# Patient Record
Sex: Male | Born: 1955 | Race: Black or African American | Hispanic: No | Marital: Single | Smoking: Current every day smoker
Health system: Southern US, Community
[De-identification: ages and names within clinical notes are randomized; demographics above are authoritative.]

## PROBLEM LIST (undated history)

## (undated) DIAGNOSIS — E119 Type 2 diabetes mellitus without complications: Secondary | ICD-10-CM

## (undated) DIAGNOSIS — I509 Heart failure, unspecified: Secondary | ICD-10-CM

## (undated) DIAGNOSIS — I219 Acute myocardial infarction, unspecified: Secondary | ICD-10-CM

## (undated) DIAGNOSIS — I10 Essential (primary) hypertension: Secondary | ICD-10-CM

## (undated) HISTORY — PX: CORONARY ANGIOPLASTY WITH STENT PLACEMENT: SHX49

---

## 1999-07-28 ENCOUNTER — Emergency Department (HOSPITAL_COMMUNITY): Admission: EM | Admit: 1999-07-28 | Discharge: 1999-07-28 | Payer: Self-pay | Admitting: Emergency Medicine

## 2007-10-14 ENCOUNTER — Inpatient Hospital Stay (HOSPITAL_COMMUNITY): Admission: EM | Admit: 2007-10-14 | Discharge: 2007-10-18 | Payer: Self-pay | Admitting: Emergency Medicine

## 2007-11-07 ENCOUNTER — Ambulatory Visit: Payer: Self-pay | Admitting: Family Medicine

## 2007-11-12 ENCOUNTER — Ambulatory Visit: Payer: Self-pay | Admitting: *Deleted

## 2008-04-15 ENCOUNTER — Ambulatory Visit: Payer: Self-pay | Admitting: Family Medicine

## 2008-04-15 LAB — CONVERTED CEMR LAB
ALT: 14 U/L
AST: 15 U/L
Albumin: 4.5 g/dL
Alkaline Phosphatase: 50 U/L
BUN: 15 mg/dL
Band Neutrophils: 0 %
Basophils Absolute: 0 K/uL
Basophils Relative: 0 %
CO2: 21 meq/L
Calcium: 9.4 mg/dL
Chloride: 106 meq/L
Cholesterol: 129 mg/dL
Creatinine, Ser: 1.13 mg/dL
Eosinophils Absolute: 0.3 K/uL
Eosinophils Relative: 4 %
Glucose, Bld: 100 mg/dL — ABNORMAL HIGH
HCT: 44.2 %
HDL: 34 mg/dL — ABNORMAL LOW
Hemoglobin: 15 g/dL
LDL Cholesterol: 69 mg/dL
Lymphocytes Relative: 39 %
Lymphs Abs: 2.7 K/uL
MCHC: 33.9 g/dL
MCV: 88 fL
Monocytes Absolute: 0.7 K/uL
Monocytes Relative: 10 %
Neutro Abs: 3.3 K/uL
Neutrophils Relative %: 47 %
PSA: 0.2 ng/mL
Platelets: 189 K/uL
Potassium: 4.3 meq/L
RBC: 5.02 M/uL
RDW: 15.1 %
Sodium: 140 meq/L
Total Bilirubin: 0.8 mg/dL
Total CHOL/HDL Ratio: 3.8
Total Protein: 7.6 g/dL
Triglycerides: 131 mg/dL
VLDL: 26 mg/dL
WBC: 7 10*3/microliter

## 2008-04-17 ENCOUNTER — Ambulatory Visit: Payer: Self-pay | Admitting: Family Medicine

## 2008-05-22 ENCOUNTER — Ambulatory Visit: Payer: Self-pay | Admitting: Internal Medicine

## 2008-10-05 ENCOUNTER — Ambulatory Visit: Payer: Self-pay | Admitting: Family Medicine

## 2008-10-27 ENCOUNTER — Ambulatory Visit: Payer: Self-pay | Admitting: Family Medicine

## 2008-10-27 LAB — CONVERTED CEMR LAB
Albumin: 4.6 g/dL (ref 3.5–5.2)
CO2: 23 meq/L (ref 19–32)
Chloride: 104 meq/L (ref 96–112)
Cholesterol: 138 mg/dL (ref 0–200)
HDL: 34 mg/dL — ABNORMAL LOW (ref >39)
Indirect Bilirubin: 0.5 mg/dL (ref 0.0–0.9)
Total Bilirubin: 0.6 mg/dL (ref 0.3–1.2)
Total CHOL/HDL Ratio: 4.1
Total Protein: 7.3 g/dL (ref 6.0–8.3)
VLDL: 31 mg/dL (ref 0–40)

## 2009-04-13 ENCOUNTER — Ambulatory Visit: Payer: Self-pay | Admitting: Family Medicine

## 2009-04-15 ENCOUNTER — Ambulatory Visit (HOSPITAL_COMMUNITY): Admission: RE | Admit: 2009-04-15 | Discharge: 2009-04-15 | Payer: Self-pay | Admitting: Family Medicine

## 2009-08-27 ENCOUNTER — Encounter (INDEPENDENT_AMBULATORY_CARE_PROVIDER_SITE_OTHER): Payer: Self-pay | Admitting: Family Medicine

## 2009-08-27 ENCOUNTER — Ambulatory Visit: Payer: Self-pay | Admitting: Internal Medicine

## 2009-08-27 LAB — CONVERTED CEMR LAB
CO2: 21 meq/L (ref 19–32)
Chloride: 103 meq/L (ref 96–112)
Glucose, Bld: 86 mg/dL (ref 70–99)
LDL Cholesterol: 85 mg/dL (ref 0–99)
Potassium: 4 meq/L (ref 3.5–5.3)
Sodium: 137 meq/L (ref 135–145)
Triglycerides: 136 mg/dL (ref <150)

## 2009-10-12 ENCOUNTER — Ambulatory Visit: Payer: Self-pay | Admitting: Internal Medicine

## 2009-10-12 ENCOUNTER — Encounter (INDEPENDENT_AMBULATORY_CARE_PROVIDER_SITE_OTHER): Payer: Self-pay | Admitting: Family Medicine

## 2009-10-12 LAB — CONVERTED CEMR LAB: Total CK: 160 units/L (ref 7–232)

## 2010-06-21 NOTE — Cardiovascular Report (Signed)
Eric Benson, Eric Benson                ACCOUNT NO.:  [id number]   MEDICAL RECORD NO.:  [id number]          PATIENT TYPE:  INP   LOCATION:  6532                         FACILITY:  [facility]   PHYSICIAN:  Mohan N. Sharyn Lull, M.D. DATE OF BIRTH:  [birth year]   DATE OF PROCEDURE:  10/17/2007  DATE OF DISCHARGE:                            CARDIAC CATHETERIZATION   PROCEDURES:  1. Successful percutaneous transluminal coronary angioplasty to      proximal and ostial left anterior descending using 2.5 x 12 mm long      Voyager balloon.  2. Successful deployment of 3.5 x 33 mm long Cypher drug-eluting stent      in proximal and ostial left anterior descending.  3. Successful post-dilatation of Cypher drug-eluting stent using 3.75      x 20 mm long [place] Voyager balloon.   INDICATION FOR THE PROCEDURE:  Eric Benson is a 55 year old black male with  past medical history significant for labile hypertension, not on any  medications; tobacco abuse; alcohol abuse; strong family history of  coronary artery disease.  He came to the ER on October 14, 2007,  complaining of recurrent retrosternal chest pressure for the last 1 week  off and on.  He did not seek any medical attention until October 14, 2007, as pain got worse, grade 8/10, associated with shortness of  breath.  Chest pain described as pressure.  Denies any nausea, vomiting,  diaphoresis.  Denies palpitation, lightheadedness, or syncope.  Denies  PND, orthopnea, leg swelling.  The patient was noted to have elevated  cardiac enzymes.  EKG showed normal sinus rhythm with LVH with strain  pattern versus ischemia.  Presently, the patient denies any chest pain.  The patient was noted to be in hypertensive urgency with initial blood  pressure was 202/147.  The patient received IV nitrates and Lasix and  beta-blockers with reduction in his blood pressure and improvement in  symptoms.  The patient did rule in for non-Q-wave myocardial infarction.  Subsequently,  the patient underwent left cardiac cath with selective  left and right coronary angiography on October 15, 2007, as per  procedure report and was noted to have critical stenosis in ostial and  proximal LAD.  The patient was also noted to have dilated LV with LVEDP  of 40 mmHg at the end of the procedure with EF of 15-20%.  The patient  was treated medically with IV Lasix and ACE and beta-blockers with good  diuresis.  He is brought here for elective PTCA stenting to proximal and  ostial LAD.   PROCEDURE:  After obtaining the informed consent, the patient was  brought to the Cath Lab and was placed on fluoroscopy table.  Right and  left groin were prepped.  With the help of thin-wall needle, 6-French  arterial sheath was placed.  The sheath was aspirated and flushed.  Next, 6-French left Judkins catheter was advanced.  Next, 6-French XD4  guiding catheter was advanced over the wire under fluoroscopic guidance  up to the ascending aorta.  Wire was pulled out.  The catheter was  aspirated and  connected to the manifold.  Catheter was further advanced  and engaged selectively into LAD.  A single view of LAD was taken.   FINDINGS:  LAD has 70% ostial and 85% proximal stenosis as before.   INTERVENTIONAL PROCEDURES:  Successful PTCA to proximal and ostial left  anterior descending was done using 2.5 x 12 mm long Voyager balloon for  predilatation and then 3.5 x 33 mm long Cypher drug-eluting stent was  deployed in proximal and ostial LAD at 15 atmospheric pressure.  Stent  was post-dilated using 3.75 x 20 mm long [place] Voyager balloon going up to  18 and 20 atmospheric pressure.  Lesion was dilated from 70 and 85% to  0% residual with excellent TIMI grade 3 distal flow without evidence of  dissection or distal embolization.  The patient  received weight-based heparin and 300 mg of Plavix during the procedure  and was continued on Integrilin drip.  The patient tolerated procedure  well.  There  were no complications.  The patient was transferred to  recovery room in stable condition.      [name]. Sharyn Lull, M.D.  Electronically Signed     MNH/MEDQ  D:  10/17/2007  T:  10/17/2007  Job:  [id number]   cc:   Cath Lab

## 2010-06-21 NOTE — Cardiovascular Report (Signed)
NAMEJEFFREN, Eric Benson NO.:  [id number]   MEDICAL RECORD NO.:  [id number]          PATIENT TYPE:  INP   LOCATION:  2903                         FACILITY:  [facility]   PHYSICIAN:  [name]. Sharyn Lull, M.D. DATE OF BIRTH:  07-18-1955   DATE OF PROCEDURE:  DATE OF DISCHARGE:                            CARDIAC CATHETERIZATION   PROCEDURES:  Left cardiac catheterization with selective left and right  coronary angiography, left ventriculography via right groin using  Judkins technique.   INDICATIONS FOR PROCEDURE:  Mr. Turnley is a 55 year old black male with  past medical history significant for labile hypertension, not on any  medications; tobacco abuse; alcohol abuse; strong family history of  coronary artery disease.  He came to the ER complaining of retrosternal  recurrent chest pressure for the last 1 week off and on.  Did not seek  any medical attention until today, as pain got worse, grade 8/10  associated with shortness of breath.  Chest pain described as pressure.  Denies any nausea, vomiting, diaphoresis.  Denies palpitation,  lightheadedness, or syncope.  Denies PND, orthopnea, or leg swelling.  The patient was noted to have elevated CPK-MB and troponin I.  EKG  showed normal sinus rhythm with LVH with strain pattern versus ischemia.  Presently, the patient denies any chest pain or shortness of breath.   PAST MEDICAL HISTORY:  As above.   PAST SURGICAL HISTORY:  None.   ALLERGIES:  None.   MEDICATIONS:  None.   SOCIAL HISTORY:  Single.  No children.  Works as Investment banker, corporate.  Smokes half-  pack per day for 28 years.  Drinks beer on weekends for the last 34  years.   FAMILY HISTORY:  Father died of stroke.  He was hypertensive at the age  of 64.  One brother died of MI at the age of 16.  Mother is  hypertensive.  One sister is hypertensive.   PHYSICAL EXAMINATION:  GENERAL:  He was alert, awake, and oriented x3 in  no acute distress.  VITAL SIGNS:  Blood  pressure was 171/95, pulse was 100, regular.  HEENT:  Conjunctivae were pink.  NECK:  Supple.  Positive JVD.  LUNGS:  Has decreased breath sounds with occasional rales.  CARDIOVASCULAR:  S1 and S2 was normal.  There was soft systolic murmur  and S3 and S4 gallop.  ABDOMEN:  Soft, bowel sounds present, nontender.  EXTREMITIES:  There is no clubbing, cyanosis, or edema.   LABORATORY DATA:  His CK was 647, MB 16.6, relative index 2.6.  Troponin  I was 0.83, second set troponin I was 4.72, third set troponin I 6.28.  CPK was 682, second set CPK 848, MB 65.7, relative index 7.7, third set  CPK 682, MB 42.6, relative index 6.2.  His BNP was 818.   BRIEF HOSPITAL COURSE:  The patient was admitted to CCU.  The patient  ruled in for non-Q-wave myocardial infarction.  Discussed with the  patient regarding left cath, possible PTCA stenting, its risks and  benefits, i.e., death, MI, stroke, need for emergency CABG, risk of  restenosis, local vascular  complications, etc. and consented for PCI.   PROCEDURE:  After obtaining the informed consent, the patient was  brought to the Cath Lab and was placed on fluoroscopy table.  Right  groin was prepped and draped in usual fashion.  A 1% Xylocaine was used  for local anesthesia in the right groin.  With the help of thin-wall  needle, a 6-French arterial sheath was placed.  The sheath was aspirated  and flushed.  Next, a 6-French left Judkins catheter was advanced over  the wire under fluoroscopic guidance up to the ascending aorta.  Wire  was pulled out.  The catheter was aspirated and connected to the  manifold.  Catheter was further advanced and engaged into left coronary  ostium.  The patient had dampening of pressure with diagnostic catheter,  which was exchanged to 6-French JL-4 guiding catheter, which was  advanced over the wire under fluoroscopic guidance up to the ascending  aorta.  Wire was pulled out.  The catheter was aspirated and connected   to the manifold.  Catheter was further advanced and engaged into left  coronary ostium.  Multiple views of the left system were taken.  Next,  the catheter was disengaged and was pulled out over the wire and was  replaced with 6-French right Judkins catheter, which was advanced over  the wire under fluoroscopic guidance up to the ascending aorta.  Wire  was pulled out.  The catheter was aspirated and connected to the  manifold.  Catheter was further advanced and attempted to engage into  right coronary ostium without success.  This catheter was exchanged to  AL-1 diagnostic catheter, which was advanced over the wire under  fluoroscopic guidance up to the ascending aorta.  Wire was pulled out.  The catheter was aspirated and connected to the manifold.  Catheter was  further advanced and engaged into right coronary ostium, which rises  anteriorly.  Multiple views of the right coronary artery was obtained.  Next, the catheter was disengaged and was pulled out over the wire and  was replaced with 6-French pigtail catheter, which was advanced over the  wire under fluoroscopic guidance up to the ascending aorta.  Wire was  pulled out.  The catheter was aspirated and connected to the manifold.  Catheter was further advanced across the aortic valve into the LV.  LV  pressures were recorded.  Next, LV graft was done in 30-degree RAO  position.  Post-angiographic pressures were recorded from LV and then  pullback pressures were recorded from the aorta.  There was no gradient  across the aortic valve.  Next, pigtail catheter was pulled out over the  wire.  Sheaths were aspirated and flushed.   FINDINGS:  LV showed moderate-to-severe LVH, global hypokinesia, EF of  15-20%.  Postprocedure LVEDP was approximately 40 mmHg.  The patient did  receive 40 of IV Lasix after the procedure.  Left main was very very  short, had almost separate LAD and left circumflex ostium.  LAD has 70%  ostial and 80-85%  proximal stenosis with haziness with TIMI 3 flow,  which appears to be the culprit lesion for his non-Q-wave MI.  Diagonal  1 has 30-40% mid diffuse stenosis.  Left circumflex has 10-15% proximal  and mid stenosis and 50-60% distal stenosis.  Diagonal obtuse marginal 1  and 2 are very very small.  OM3 has 30-40% proximal stenosis.  RCA has  20-30% diffuse proximal and mid stenosis.  PDA and PLV branches were  small,  which were patent.  The patient tolerated the procedure well.  There were no complications.  Plan is to maximize his ACE and beta-  blockers and diuretics, and we will schedule him for PCI to LAD in few  days when he is fully compensated.      [name]. Sharyn Lull, M.D.  Electronically Signed     MNH/MEDQ  D:  10/15/2007  T:  10/15/2007  Job:  [id number]   cc:   Cath Lab

## 2010-06-21 NOTE — Discharge Summary (Signed)
[name], Benson                ACCOUNT NO.:  [id number]   MEDICAL RECORD NO.:  [id number]          PATIENT TYPE:  INP   LOCATION:  6532                         FACILITY:  [facility]   PHYSICIAN:  Mohan N. Sharyn Lull, M.D. DATE OF BIRTH:  [birth year]   DATE OF ADMISSION:  10/14/2007  DATE OF DISCHARGE:  10/18/2007                               DISCHARGE SUMMARY   ADMITTING DIAGNOSES:  1. Status post recent non-Q-wave myocardial infarction.  2. Hypertensive urgency.  3. Complicated mild congestive heart failure.  4. Tobacco abuse.  5. Alcohol abuse.   FINAL DIAGNOSES:  1. Status post recent non-Q-wave myocardial infarction.  2. Status post left catheterization and percutaneous transluminal      coronary angioplasty stenting to proximal and ostial left anterior      descending.  3. Status post hypertensive urgency.  4. Concentrated congestive systolic heart failure.  5. Hypercholesterolemia.  6. History of tobacco abuse and alcohol abuse.   DISCHARGE HOME MEDICATIONS:  1. Enteric-coated aspirin 325 mg 1 tablet daily.  2. Plavix 75 mg 1 tablet daily with food.  3. Coreg 12.5 mg 1 tablet every 12 hours.  4. Lotensin 20 mg 1 tablet twice daily.  5. Crestor 20 mg 1 tablet daily.  6. Nitrostat 0.4 mg sublingual, use as directed.   DIET:  Low-salt and low-cholesterol.   Post PTCA stent instructions have been given.  The patient has been  advised to increase activity slowly.  If he develops shortness of  breath, swelling of legs, or gains more than 2 pounds, should call my  office immediately.  Followup with me in 1 week.   CONDITION AT DISCHARGE:  Stable.  The patient is scheduled for phase 2  cardiac rehab as outpatient.   BRIEF HISTORY AND HOSPITAL COURSE:  [name] is a 55 year old black male  with past medical history significant for labile hypertension, not on  any medications, tobacco abuse, alcohol abuse, and strong family history  of coronary artery disease.  He came to the  ER complaining of recurrent  retrosternal chest pressure for last 1 week off and on, did not seek any  medical attention, until day of admission as the pain got worse, rate  8/10 associated with shortness of breath, chest pain described as  pressure.  Denies any nausea, vomiting, or diaphoresis.  Denies  palpitation, lightheadedness or syncope.  Denies PND, orthopnea, and leg  swelling.  The patient was noted to have elevated CPK-MB and troponin I.  EKG showed normal sinus rhythm with LVH with strain pattern versus  ischemia.  Presently, the patient denies any chest pain or shortness of  breath.   PAST MEDICAL HISTORY:  As above.   PAST SURGICAL HISTORY:  None.   ALLERGIES:  None.   MEDICATION:  None.   SOCIAL HISTORY:  Single, no children.  The patient smoked half-pack per  day for 28 years, drinks beer on weekends for 34 years.   FAMILY HISTORY:  His father died of a stroke, labile hypertension at the  age of 59, mother is alive, she is hypertensive, 1 brother died  of MI at  the age of 75, and 1 sister is hypertensive.   PHYSICAL EXAMINATION:  GENERAL:  Alert, awake, and oriented x3, in no  acute distress.  VITAL SIGNS:  Initial blood pressure was 202/147, the peak pressure of  IV nitrates, morphine and beta blockers  was 171/95, pulse was 100.  Initial pulse was 120, final check, repeat pulse was 100.  HEENT:  Conjunctivae was pink.  NECK:  Supple.  No JVD.  LUNGS:  He has decreased breath sound at bases with occasional rales.  CARDIOVASCULAR:  S1 and S2 was normal.  There was soft systolic murmur  and S3 and S4 gallop.  ABDOMEN:  Soft.  Bowel sounds were present, nontender.  EXTREMITIES:  There is no clubbing, cyanosis, or edema.   LABORATORIES:  Hemoglobin was 15.6, hematocrit 47.2, and white count of  8.6.  CPK-MB was 13.9 and troponin I was 0.29.  Repeat CPK by lab was  647, MB 16.6, and relative index 2.6.  Troponin I was 0.83.  BNP was  818.  Repeat CPK on October 16, 2007, was 2.2, MB 7.0, troponin I was  2.81, and BNP was 313.  BUN 8 and creatinine 1.03.   BRIEF HOSPITAL COURSE:  The patient was admitted to CPU.  The patient  ruled in for non-Q-wave myocardial infarction.  The patient was started  on IV heparin, nitrates, and Integrilin, and subsequently underwent left  cardiac cath on October 15, 2007.  As per procedure report, the patient  postprocedure had very high LVEDP and was decided to treat for heart  failure cause and bring him back intervention through LAD.  The patient  was subsequently diuresed and was continued on beta-blockers and ACE  inhibitor, and was brought back to the cath lab and underwent PTCA  stenting to proximal and ostial LAD as per procedure request on  October 17, 2007.  The patient did not have any episodes of chest pain  during the hospital stay and volume appears to be stable with no  evidence of hematoma or bruits.  Phase 1 cardiac rehab was called.  The  patient has been ambulating in hallway without any problems.  His groin  is stable.  The patient will be discharged home on all of her  medications and will be followed up in the office in 1 week.  He will  repeat his 2-D echo in few weeks if his LVEF remains below 35% with  myocardial infarct.  ICD implantation as outpatient.  The patient also  has been advised to stop smoking and drinking alcohol to which he  agrees.  The patient is scheduled for phase 2 cardiac rehab as an  outpatient.     Eric Benson. Sharyn Lull, M.D.  Electronically Signed    MNH/MEDQ  D:  10/18/2007  T:  10/19/2007  Job:  [id number]

## 2010-11-09 LAB — CBC
HCT: 36.3 — ABNORMAL LOW
Hemoglobin: 10.3 — ABNORMAL LOW
Hemoglobin: 12.4 — ABNORMAL LOW
Hemoglobin: 12.5 — ABNORMAL LOW
Hemoglobin: 15.6
MCHC: 33.3
MCHC: 33.7
MCHC: 33.8
MCHC: 34.3
MCV: 90.2
MCV: 91.1
MCV: 91.6
Platelets: 136 — ABNORMAL LOW
Platelets: 166
Platelets: 191
RBC: 3.32 — ABNORMAL LOW
RBC: 4.56
RBC: 5.09
RDW: 13.9
RDW: 13.9
RDW: 14
WBC: 7.9
WBC: 8.6
WBC: 9

## 2010-11-09 LAB — HEPARIN LEVEL (UNFRACTIONATED)
Heparin Unfractionated: 0.26 — ABNORMAL LOW
Heparin Unfractionated: 0.36
Heparin Unfractionated: 0.37
Heparin Unfractionated: 0.48
Heparin Unfractionated: 0.52

## 2010-11-09 LAB — BASIC METABOLIC PANEL
CO2: 26
CO2: 26
CO2: 27
Calcium: 8.6
Chloride: 102
Chloride: 105
Creatinine, Ser: 1.07
Creatinine, Ser: 1.1
GFR calc Af Amer: >60
GFR calc Af Amer: >60
GFR calc non Af Amer: >60
Glucose, Bld: 139 — ABNORMAL HIGH
Potassium: 3.3 — ABNORMAL LOW
Potassium: 3.8
Sodium: 135
Sodium: 136

## 2010-11-09 LAB — POCT I-STAT, CHEM 8
BUN: 12
Creatinine, Ser: 1.4
Hemoglobin: 15.3

## 2010-11-09 LAB — CK TOTAL AND CKMB (NOT AT ARMC)
CK, MB: 16.6 — ABNORMAL HIGH
Relative Index: 2.6 — ABNORMAL HIGH
Total CK: 647 — ABNORMAL HIGH

## 2010-11-09 LAB — COMPREHENSIVE METABOLIC PANEL
Albumin: 3.2 — ABNORMAL LOW
Alkaline Phosphatase: 44
BUN: 8
Creatinine, Ser: 1.03
Glucose, Bld: 95
Total Bilirubin: 1
Total Protein: 5.9 — ABNORMAL LOW

## 2010-11-09 LAB — PROTIME-INR: INR: 1

## 2010-11-09 LAB — DIFFERENTIAL
Lymphocytes Relative: 56 — ABNORMAL HIGH
Neutro Abs: 2.8
Neutrophils Relative %: 33 — ABNORMAL LOW

## 2010-11-09 LAB — RAPID URINE DRUG SCREEN, HOSP PERFORMED
Amphetamines: NOT DETECTED
Barbiturates: NOT DETECTED
Benzodiazepines: NOT DETECTED
Opiates: POSITIVE — AB
Tetrahydrocannabinol: NOT DETECTED

## 2010-11-09 LAB — CARDIAC PANEL(CRET KIN+CKTOT+MB+TROPI)
CK, MB: 15.3 — ABNORMAL HIGH
CK, MB: 42.6 — ABNORMAL HIGH
CK, MB: 65.7 — ABNORMAL HIGH
Relative Index: 4 — ABNORMAL HIGH
Relative Index: 6.2 — ABNORMAL HIGH
Relative Index: 7.7 — ABNORMAL HIGH
Total CK: 379 — ABNORMAL HIGH
Total CK: 682 — ABNORMAL HIGH
Troponin I: 2.81

## 2010-11-09 LAB — POCT CARDIAC MARKERS
CKMB, poc: 13.9
CKMB, poc: 15.5
Myoglobin, poc: 111
Myoglobin, poc: 360
Troponin i, poc: 0.5

## 2010-11-09 LAB — LIPID PANEL
Cholesterol: 154
LDL Cholesterol: 109 — ABNORMAL HIGH
Total CHOL/HDL Ratio: 5.3

## 2010-11-09 LAB — B-NATRIURETIC PEPTIDE (CONVERTED LAB)
Pro B Natriuretic peptide (BNP): 313 — ABNORMAL HIGH
Pro B Natriuretic peptide (BNP): 349 — ABNORMAL HIGH
Pro B Natriuretic peptide (BNP): 818 — ABNORMAL HIGH

## 2010-11-09 LAB — TROPONIN I: Troponin I: 0.83

## 2010-11-09 LAB — APTT: aPTT: 35

## 2015-08-19 ENCOUNTER — Emergency Department (HOSPITAL_COMMUNITY)
Admission: EM | Admit: 2015-08-19 | Discharge: 2015-08-19 | Disposition: A | Discharge status: Home / Self Care | Payer: Medicare Other | Attending: Emergency Medicine | Admitting: Emergency Medicine

## 2015-08-19 ENCOUNTER — Encounter (HOSPITAL_COMMUNITY): Payer: Self-pay | Admitting: Emergency Medicine

## 2015-08-19 ENCOUNTER — Emergency Department (HOSPITAL_COMMUNITY): Payer: Medicare Other

## 2015-08-19 DIAGNOSIS — F17200 Nicotine dependence, unspecified, uncomplicated: Secondary | ICD-10-CM | POA: Insufficient documentation

## 2015-08-19 DIAGNOSIS — M940 Chondrocostal junction syndrome [Tietze]: Secondary | ICD-10-CM | POA: Diagnosis not present

## 2015-08-19 DIAGNOSIS — I10 Essential (primary) hypertension: Secondary | ICD-10-CM | POA: Insufficient documentation

## 2015-08-19 DIAGNOSIS — I252 Old myocardial infarction: Secondary | ICD-10-CM | POA: Diagnosis not present

## 2015-08-19 DIAGNOSIS — R0789 Other chest pain: Secondary | ICD-10-CM | POA: Diagnosis present

## 2015-08-19 HISTORY — DX: Acute myocardial infarction, unspecified: I21.9

## 2015-08-19 HISTORY — DX: Essential (primary) hypertension: I10

## 2015-08-19 LAB — CBC
HCT: 43.6 % (ref 39.0–52.0)
Hemoglobin: 15.2 g/dL (ref 13.0–17.0)
MCH: 31.5 pg (ref 26.0–34.0)
MCHC: 34.9 g/dL (ref 30.0–36.0)
MCV: 90.5 fL (ref 78.0–100.0)
PLATELETS: 213 10*3/uL (ref 150–400)
RBC: 4.82 MIL/uL (ref 4.22–5.81)
RDW: 13.8 % (ref 11.5–15.5)
WBC: 7.1 10*3/uL (ref 4.0–10.5)

## 2015-08-19 LAB — BASIC METABOLIC PANEL
ANION GAP: 8 (ref 5–15)
BUN: <5 mg/dL — ABNORMAL LOW (ref 6–20)
CALCIUM: 9.3 mg/dL (ref 8.9–10.3)
CO2: 23 mmol/L (ref 22–32)
CREATININE: 0.91 mg/dL (ref 0.61–1.24)
Chloride: 102 mmol/L (ref 101–111)
Glucose, Bld: 102 mg/dL — ABNORMAL HIGH (ref 65–99)
Potassium: 3.7 mmol/L (ref 3.5–5.1)
SODIUM: 133 mmol/L — AB (ref 135–145)

## 2015-08-19 MED ORDER — PREDNISONE 20 MG PO TABS: ORAL_TABLET | ORAL | Status: DC

## 2015-08-19 MED ORDER — HYDROCODONE-HOMATROPINE 5-1.5 MG/5ML PO SYRP: 5.0000 mL | ORAL_SOLUTION | Freq: Four times a day (QID) | ORAL | Status: DC | PRN

## 2015-08-19 MED ORDER — ALBUTEROL SULFATE HFA 108 (90 BASE) MCG/ACT IN AERS: 2.0000 | INHALATION_SPRAY | Freq: Four times a day (QID) | RESPIRATORY_TRACT | Status: DC | PRN

## 2015-08-19 MED ORDER — DOXYCYCLINE HYCLATE 50 MG PO CAPS: 100.0000 mg | ORAL_CAPSULE | Freq: Two times a day (BID) | ORAL | Status: DC

## 2015-08-19 NOTE — ED Provider Notes (Signed)
CSN: [id number]     Arrival date & time 08/19/15  1155 History   First MD Initiated Contact with Patient 08/19/15 1311     Chief Complaint  Patient presents with  . Chest Pain  . URI    (Consider location/radiation/quality/duration/timing/severity/associated sxs/prior Treatment) HPI   Eric Benson is a 59-y/o male with history of MI, HTN, and tobacco abuse who presents with L chest wall pain. He has had cough and congestion for several days and woke up last night around 0230 with sharp pain in his L chest wall that felt like "someone hitting me with a baseball bat." Since then, every time he coughs the pain returns. It does not radiate elsewhere. He has not tried anything for pain. He denies shortness of breath, fevers, or chills. No change in appetite. He does report sputum production with phlegm that was clear when cough started but since today has been yellow. He has been smoking for 30 years but has never been diagnosed with COPD or had PFTs. He has not taken his blood pressure medication yet today.  Past Medical History  Diagnosis Date  . MI (myocardial infarction) (HCC)   . Hypertension    Past Surgical History  Procedure Laterality Date  . Coronary angioplasty with stent placement     History reviewed. No pertinent family history. Social History  Substance Use Topics  . Smoking status: Current Every Day Smoker  . Smokeless tobacco: None  . Alcohol Use: No    Review of Systems  Constitutional: Negative for fever, chills and fatigue.  HENT: Positive for congestion and rhinorrhea.   Eyes: Negative for itching.  Respiratory: Positive for cough. Negative for shortness of breath and wheezing.   Cardiovascular: Negative for chest pain.  Gastrointestinal: Negative for nausea, vomiting and diarrhea.  Musculoskeletal: Negative for myalgias.  Skin: Negative for rash.  Neurological: Negative for headaches.    Allergies  Review of patient's allergies indicates not on  file.  Home Medications   Prior to Admission medications   Medication Sig Start Date End Date Taking? Authorizing Provider  albuterol (PROVENTIL HFA;VENTOLIN HFA) 108 (90 Base) MCG/ACT inhaler Inhale 2 puffs into the lungs every 6 (six) hours as needed for wheezing or shortness of breath. 08/19/15   Hillary Percell Boston, MD  doxycycline (VIBRAMYCIN) 50 MG capsule Take 2 capsules (100 mg total) by mouth 2 (two) times daily. 08/19/15   Hillary Percell Boston, MD  HYDROcodone-homatropine [facility]) 5-1.5 MG/5ML syrup Take 5 mLs by mouth every 6 (six) hours as needed for cough. 08/19/15   Hillary Percell Boston, MD  predniSONE (DELTASONE) 20 MG tablet Take 40 mg daily with food x 5 days. 08/19/15   Hillary Percell Boston, MD   BP 166/122 mmHg  Pulse 93  Temp(Src) 99 F (37.2 C) (Oral)  Resp 18  SpO2 97% Physical Exam  Constitutional: He is oriented to person, place, and time. He appears well-developed and well-nourished. No distress.  Obese, pleasant  HENT:  Head: Normocephalic and atraumatic.  Mouth/Throat: Oropharynx is clear and moist.  Eyes: Conjunctivae and EOM are normal. Pupils are equal, round, and reactive to light.  Neck: Normal range of motion. Neck supple.  Cardiovascular: Normal rate, regular rhythm, normal heart sounds and intact distal pulses.   No murmur heard. Pulmonary/Chest: Effort normal and breath sounds normal. No respiratory distress. He has no wheezes. He has no rales. He exhibits tenderness (TTP over L lateral lower chest wall along mid-axillary line).  Abdominal: Soft. Bowel sounds are normal.  He exhibits no distension. There is no tenderness. There is no rebound and no guarding.  Musculoskeletal: Normal range of motion. He exhibits no edema.  Focal chest wall tenderness limited to left lateral chest wall.   Neurological: He is alert and oriented to person, place, and time.  Skin: Skin is warm and dry.  Psychiatric: He has a normal mood and affect. His behavior  is normal.  Nursing note and vitals reviewed.   ED Course  Procedures (including critical care time) Labs Review Labs Reviewed  BASIC METABOLIC PANEL - Abnormal; Notable for the following:    Sodium 133 (*)    Glucose, Bld 102 (*)    BUN <5 (*)    All other components within normal limits  CBC    Imaging Review Dg Chest 2 View  08/19/2015  CLINICAL DATA:  Cough and right-sided chest pain, initial encounter EXAM: CHEST  2 VIEW COMPARISON:  None. FINDINGS: Cardiac shadow is at the upper limits of normal in size. The lungs are mildly hyperinflated without focal infiltrate or sizable effusion. No acute bony abnormality is seen. No pneumothorax is noted. IMPRESSION: No acute abnormality noted. Electronically Signed   By: Alcide CleverMark  Lukens M.D.   On: 08/19/2015 12:53   I have personally reviewed and evaluated these images and lab results as part of my medical decision-making.   EKG Interpretation   Date/Time:  Thursday August 19 2015 13:49:31 EDT Ventricular Rate:  71 PR Interval:    QRS Duration: 122 QT Interval:  406 QTC Calculation: 442 R Axis:   54 Text Interpretation:  Sinus rhythm Prolonged PR interval Nonspecific  intraventricular conduction delay RSR in inferior leads. slow r  progression Confirmed by Fayrene FearingJAMES  MD, MARK ([id number]) on 08/19/2015 2:25:12 PM      MDM   Final diagnoses:  Costochondritis   Pt presents with chest wall pain in setting of URI vs. COPD exacerbation. CXR showed hyperinflated lungs, that could indicate underlying COPD, but showed no evidence of pneumonia or any obvious rib fractures. EKG similar to previous tracings. Suspect costochondritis. Prescribed hycodan cough syrup for symptom relief. Also prescribed 5 day course of doxcycline and prednisone for possible COPD exacerbation. Prescribed albuterol inhaler for patient to fill if he develops SOB. Recommended getting PFTs done once he is feeling better.  Dani GobbleHillary Fitzgerald, MD [facility],  PGY-2    [facility] Moen Fitzgerald, MD 08/20/15 1338  Rolland PorterMark James, MD 08/28/15 [phone]

## 2015-08-19 NOTE — ED Provider Notes (Signed)
Pt c/o Left chest pain with breathing.  Sudden onset last few days.  "Cold " with URO over the weekend.  Lungs CTA bilaterally.  EKG and CXR without changes.  Probable pleurisy.  No PE risks.  Not hypoxemic or tachycardic.  Agree with DC with sx treatment and TX for COPD exacerbation.  Rolland PorterMark Caylen Yardley, MD 08/19/15 [phone]

## 2015-08-19 NOTE — ED Notes (Addendum)
Pt states was coughing yesterday morning and felt a severe pain in left side "felt like I had been hit with a bat" -- has had a cold symptoms with a productive cough for yellow sputum. Lung sounds clear bilaterally

## 2015-08-19 NOTE — Discharge Instructions (Signed)
Chest Wall Pain °Chest wall pain is pain in or around the bones and muscles of your chest. Sometimes, an injury causes this pain. Sometimes, the cause may not be known. This pain may take several weeks or longer to get better. °HOME CARE °Pay attention to any changes in your symptoms. Take these actions to help with your pain: °· Rest as told by your doctor. °· Avoid activities that cause pain. Try not to use your chest, belly (abdominal), or side muscles to lift heavy things. °· If directed, apply ice to the painful area: °¨ Put ice in a plastic bag. °¨ Place a towel between your skin and the bag. °¨ Leave the ice on for 20 minutes, 2-3 times per day. °· Take over-the-counter and prescription medicines only as told by your doctor. °· Do not use tobacco products, including cigarettes, chewing tobacco, and e-cigarettes. If you need help quitting, ask your doctor. °· Keep all follow-up visits as told by your doctor. This is important. °GET HELP IF: °· You have a fever. °· Your chest pain gets worse. °· You have new symptoms. °GET HELP RIGHT AWAY IF: °· You feel sick to your stomach (nauseous) or you throw up (vomit). °· You feel sweaty or light-headed. °· You have a cough with phlegm (sputum) or you cough up blood. °· You are short of breath. °  °This information is not intended to replace advice given to you by your health care provider. Make sure you discuss any questions you have with your health care provider. °  °Document Released: 07/12/2007 Document Revised: 10/14/2014 Document Reviewed: 04/20/2014 °Elsevier Interactive Patient Education ©2016 [employer] Inc[employer] ° °

## 2016-12-04 ENCOUNTER — Emergency Department (HOSPITAL_COMMUNITY): Payer: Medicare Other

## 2016-12-04 ENCOUNTER — Encounter (HOSPITAL_COMMUNITY): Admission: EM | Disposition: E | Payer: Self-pay | Source: Home / Self Care | Attending: Cardiology

## 2016-12-04 ENCOUNTER — Inpatient Hospital Stay (HOSPITAL_COMMUNITY): Payer: Medicare Other

## 2016-12-04 ENCOUNTER — Encounter (HOSPITAL_COMMUNITY): Payer: Self-pay

## 2016-12-04 ENCOUNTER — Inpatient Hospital Stay (HOSPITAL_COMMUNITY)
Admission: EM | Admit: 2016-12-04 | Discharge: 2016-12-07 | DRG: 270 | Disposition: E | Payer: Medicare Other | Attending: Cardiology | Admitting: Cardiology

## 2016-12-04 DIAGNOSIS — I255 Ischemic cardiomyopathy: Secondary | ICD-10-CM | POA: Diagnosis present

## 2016-12-04 DIAGNOSIS — E785 Hyperlipidemia, unspecified: Secondary | ICD-10-CM | POA: Diagnosis present

## 2016-12-04 DIAGNOSIS — I472 Ventricular tachycardia, unspecified: Secondary | ICD-10-CM

## 2016-12-04 DIAGNOSIS — Y838 Other surgical procedures as the cause of abnormal reaction of the patient, or of later complication, without mention of misadventure at the time of the procedure: Secondary | ICD-10-CM | POA: Diagnosis present

## 2016-12-04 DIAGNOSIS — E119 Type 2 diabetes mellitus without complications: Secondary | ICD-10-CM | POA: Diagnosis present

## 2016-12-04 DIAGNOSIS — R0603 Acute respiratory distress: Secondary | ICD-10-CM | POA: Diagnosis present

## 2016-12-04 DIAGNOSIS — I442 Atrioventricular block, complete: Secondary | ICD-10-CM | POA: Diagnosis not present

## 2016-12-04 DIAGNOSIS — I5021 Acute systolic (congestive) heart failure: Secondary | ICD-10-CM | POA: Diagnosis present

## 2016-12-04 DIAGNOSIS — F1010 Alcohol abuse, uncomplicated: Secondary | ICD-10-CM | POA: Diagnosis present

## 2016-12-04 DIAGNOSIS — F17210 Nicotine dependence, cigarettes, uncomplicated: Secondary | ICD-10-CM | POA: Diagnosis present

## 2016-12-04 DIAGNOSIS — T82855A Stenosis of coronary artery stent, initial encounter: Principal | ICD-10-CM | POA: Diagnosis present

## 2016-12-04 DIAGNOSIS — I509 Heart failure, unspecified: Secondary | ICD-10-CM

## 2016-12-04 DIAGNOSIS — I110 Hypertensive heart disease with heart failure: Secondary | ICD-10-CM | POA: Diagnosis present

## 2016-12-04 DIAGNOSIS — I5023 Acute on chronic systolic (congestive) heart failure: Secondary | ICD-10-CM | POA: Diagnosis present

## 2016-12-04 DIAGNOSIS — F17200 Nicotine dependence, unspecified, uncomplicated: Secondary | ICD-10-CM | POA: Diagnosis present

## 2016-12-04 DIAGNOSIS — R570 Cardiogenic shock: Secondary | ICD-10-CM | POA: Diagnosis not present

## 2016-12-04 DIAGNOSIS — I2102 ST elevation (STEMI) myocardial infarction involving left anterior descending coronary artery: Secondary | ICD-10-CM

## 2016-12-04 DIAGNOSIS — I2510 Atherosclerotic heart disease of native coronary artery without angina pectoris: Secondary | ICD-10-CM | POA: Diagnosis present

## 2016-12-04 DIAGNOSIS — R079 Chest pain, unspecified: Secondary | ICD-10-CM

## 2016-12-04 DIAGNOSIS — I420 Dilated cardiomyopathy: Secondary | ICD-10-CM | POA: Diagnosis present

## 2016-12-04 DIAGNOSIS — Z955 Presence of coronary angioplasty implant and graft: Secondary | ICD-10-CM | POA: Diagnosis not present

## 2016-12-04 DIAGNOSIS — I161 Hypertensive emergency: Secondary | ICD-10-CM | POA: Diagnosis present

## 2016-12-04 DIAGNOSIS — I2109 ST elevation (STEMI) myocardial infarction involving other coronary artery of anterior wall: Secondary | ICD-10-CM | POA: Diagnosis present

## 2016-12-04 DIAGNOSIS — Z79899 Other long term (current) drug therapy: Secondary | ICD-10-CM

## 2016-12-04 DIAGNOSIS — Z6835 Body mass index (BMI) 35.0-35.9, adult: Secondary | ICD-10-CM

## 2016-12-04 DIAGNOSIS — Z888 Allergy status to other drugs, medicaments and biological substances status: Secondary | ICD-10-CM

## 2016-12-04 DIAGNOSIS — Z7952 Long term (current) use of systemic steroids: Secondary | ICD-10-CM

## 2016-12-04 DIAGNOSIS — I252 Old myocardial infarction: Secondary | ICD-10-CM | POA: Diagnosis not present

## 2016-12-04 DIAGNOSIS — Z716 Tobacco abuse counseling: Secondary | ICD-10-CM | POA: Diagnosis not present

## 2016-12-04 DIAGNOSIS — R001 Bradycardia, unspecified: Secondary | ICD-10-CM | POA: Diagnosis not present

## 2016-12-04 HISTORY — PX: CORONARY ANGIOGRAPHY: CATH118303

## 2016-12-04 HISTORY — PX: IABP INSERTION: CATH118242

## 2016-12-04 HISTORY — DX: Type 2 diabetes mellitus without complications: E11.9

## 2016-12-04 HISTORY — DX: Heart failure, unspecified: I50.9

## 2016-12-04 LAB — TROPONIN I
Troponin I: 0.04 ng/mL (ref <0.03)
Troponin I: 0.23 ng/mL (ref <0.03)
Troponin I: 19.91 ng/mL (ref <0.03)
Troponin I: >65 ng/mL (ref <0.03)

## 2016-12-04 LAB — ECHOCARDIOGRAM COMPLETE
Ao-asc: 41 cm
EWDT: 113 ms
FS: 17 % — AB (ref 28–44)
HEIGHTINCHES: 71 in
IV/PV OW: 1.17
LA ID, A-P, ES: 50 mm
LA diam end sys: 50 mm
LA vol index: 46.3 mL/m2
LADIAMINDEX: 2.12 cm/m2
LAVOL: 109 mL
LAVOLA4C: 119 mL
LDCA: 2.84 cm2
LVOT diameter: 19 mm
MV Dec: 113
MV pk E vel: 69.8 m/s
PW: 10.6 mm — AB (ref 0.6–1.1)
TAPSE: 20.3 mm
Weight: 3795.44 oz

## 2016-12-04 LAB — HEPARIN LEVEL (UNFRACTIONATED): Heparin Unfractionated: 0.64 IU/mL (ref 0.30–0.70)

## 2016-12-04 LAB — CBG MONITORING, ED: Glucose-Capillary: 171 mg/dL — ABNORMAL HIGH (ref 65–99)

## 2016-12-04 LAB — CBC WITH DIFFERENTIAL/PLATELET
BASOS PCT: 1 %
Basophils Absolute: 0.1 10*3/uL (ref 0.0–0.1)
EOS PCT: 2 %
Eosinophils Absolute: 0.2 10*3/uL (ref 0.0–0.7)
HEMATOCRIT: 48 % (ref 39.0–52.0)
HEMOGLOBIN: 16 g/dL (ref 13.0–17.0)
LYMPHS PCT: 57 %
Lymphs Abs: 6.2 10*3/uL — ABNORMAL HIGH (ref 0.7–4.0)
MCH: 31.5 pg (ref 26.0–34.0)
MCHC: 33.3 g/dL (ref 30.0–36.0)
MCV: 94.5 fL (ref 78.0–100.0)
MONOS PCT: 5 %
Monocytes Absolute: 0.6 10*3/uL (ref 0.1–1.0)
NEUTROS ABS: 3.9 10*3/uL (ref 1.7–7.7)
Neutrophils Relative %: 35 %
Platelets: 178 10*3/uL (ref 150–400)
RBC: 5.08 MIL/uL (ref 4.22–5.81)
RDW: 14.1 % (ref 11.5–15.5)
WBC: 11 10*3/uL — ABNORMAL HIGH (ref 4.0–10.5)

## 2016-12-04 LAB — COMPREHENSIVE METABOLIC PANEL
ALBUMIN: 3.7 g/dL (ref 3.5–5.0)
ALK PHOS: 56 U/L (ref 38–126)
ALT: 15 U/L — ABNORMAL LOW (ref 17–63)
ANION GAP: 12 (ref 5–15)
AST: 29 U/L (ref 15–41)
BILIRUBIN TOTAL: 0.8 mg/dL (ref 0.3–1.2)
BUN: 8 mg/dL (ref 6–20)
CALCIUM: 8.7 mg/dL — AB (ref 8.9–10.3)
CO2: 21 mmol/L — ABNORMAL LOW (ref 22–32)
Chloride: 104 mmol/L (ref 101–111)
Creatinine, Ser: 1.3 mg/dL — ABNORMAL HIGH (ref 0.61–1.24)
GFR calc non Af Amer: 58 mL/min — ABNORMAL LOW (ref >60)
GLUCOSE: 183 mg/dL — AB (ref 65–99)
Potassium: 4.1 mmol/L (ref 3.5–5.1)
Sodium: 137 mmol/L (ref 135–145)
TOTAL PROTEIN: 6.6 g/dL (ref 6.5–8.1)

## 2016-12-04 LAB — GLUCOSE, CAPILLARY
GLUCOSE-CAPILLARY: 143 mg/dL — AB (ref 65–99)
Glucose-Capillary: 158 mg/dL — ABNORMAL HIGH (ref 65–99)

## 2016-12-04 LAB — I-STAT CHEM 8, ED
BUN: 8 mg/dL (ref 6–20)
CALCIUM ION: 1.12 mmol/L — AB (ref 1.15–1.40)
Chloride: 102 mmol/L (ref 101–111)
Creatinine, Ser: 1.2 mg/dL (ref 0.61–1.24)
Glucose, Bld: 184 mg/dL — ABNORMAL HIGH (ref 65–99)
HEMATOCRIT: 49 % (ref 39.0–52.0)
HEMOGLOBIN: 16.7 g/dL (ref 13.0–17.0)
Potassium: 4 mmol/L (ref 3.5–5.1)
Sodium: 138 mmol/L (ref 135–145)
TCO2: 23 mmol/L (ref 22–32)

## 2016-12-04 LAB — MRSA PCR SCREENING: MRSA by PCR: NEGATIVE

## 2016-12-04 LAB — BRAIN NATRIURETIC PEPTIDE: B NATRIURETIC PEPTIDE 5: 2744.8 pg/mL — AB (ref 0.0–100.0)

## 2016-12-04 LAB — I-STAT TROPONIN, ED: Troponin i, poc: 0.06 ng/mL (ref 0.00–0.08)

## 2016-12-04 LAB — HEMOGLOBIN A1C
HEMOGLOBIN A1C: 6.1 % — AB (ref 4.8–5.6)
Mean Plasma Glucose: 128.37 mg/dL

## 2016-12-04 LAB — PROTIME-INR
INR: 1.07
Prothrombin Time: 13.8 seconds (ref 11.4–15.2)

## 2016-12-04 LAB — TSH: TSH: 1.271 u[IU]/mL (ref 0.350–4.500)

## 2016-12-04 LAB — PATHOLOGIST SMEAR REVIEW

## 2016-12-04 SURGERY — IABP INSERTION
Anesthesia: LOCAL

## 2016-12-04 MED ORDER — LIVING BETTER WITH HEART FAILURE BOOK: Freq: Once | Status: DC

## 2016-12-04 MED ORDER — FUROSEMIDE 10 MG/ML IJ SOLN
40.0000 mg | Freq: Once | INTRAMUSCULAR | Status: AC
  Administered 2016-12-04: 40 mg via INTRAVENOUS

## 2016-12-04 MED ORDER — ASPIRIN 81 MG PO CHEW: 81.0000 mg | CHEWABLE_TABLET | ORAL | Status: DC

## 2016-12-04 MED ORDER — POTASSIUM CHLORIDE CRYS ER 10 MEQ PO TBCR
10.0000 meq | EXTENDED_RELEASE_TABLET | Freq: Two times a day (BID) | ORAL | Status: DC
  Administered 2016-12-05: 10 meq via ORAL
  Filled 2016-12-04 (×3): qty 1

## 2016-12-04 MED ORDER — ATORVASTATIN CALCIUM 80 MG PO TABS
80.0000 mg | ORAL_TABLET | Freq: Every day | ORAL | Status: DC
  Administered 2016-12-04: 80 mg via ORAL
  Filled 2016-12-04: qty 1

## 2016-12-04 MED ORDER — NITROGLYCERIN 2 % TD OINT
1.0000 [in_us] | TOPICAL_OINTMENT | Freq: Once | TRANSDERMAL | Status: AC
  Administered 2016-12-04: 1 [in_us] via TOPICAL
  Filled 2016-12-04: qty 1

## 2016-12-04 MED ORDER — FUROSEMIDE 10 MG/ML IJ SOLN
80.0000 mg | Freq: Once | INTRAMUSCULAR | Status: DC
  Filled 2016-12-04: qty 8

## 2016-12-04 MED ORDER — HEPARIN (PORCINE) IN NACL 100-0.45 UNIT/ML-% IJ SOLN
1350.0000 [IU]/h | INTRAMUSCULAR | Status: DC
  Administered 2016-12-04: 1400 [IU]/h via INTRAVENOUS
  Filled 2016-12-04: qty 250

## 2016-12-04 MED ORDER — FENTANYL CITRATE (PF) 100 MCG/2ML IJ SOLN
INTRAMUSCULAR | Status: DC | PRN
  Administered 2016-12-04: 25 ug via INTRAVENOUS

## 2016-12-04 MED ORDER — CLOPIDOGREL BISULFATE 75 MG PO TABS: 75.0000 mg | ORAL_TABLET | Freq: Every day | ORAL | Status: DC

## 2016-12-04 MED ORDER — IOPAMIDOL (ISOVUE-370) INJECTION 76%
INTRAVENOUS | Status: DC | PRN
  Administered 2016-12-04: 290 mL

## 2016-12-04 MED ORDER — SODIUM CHLORIDE 0.9% FLUSH
3.0000 mL | Freq: Two times a day (BID) | INTRAVENOUS | Status: DC
  Administered 2016-12-04: 3 mL via INTRAVENOUS

## 2016-12-04 MED ORDER — METHYLPREDNISOLONE SODIUM SUCC 125 MG IJ SOLR
125.0000 mg | Freq: Once | INTRAMUSCULAR | Status: AC
  Administered 2016-12-04: 125 mg via INTRAVENOUS
  Filled 2016-12-04: qty 2

## 2016-12-04 MED ORDER — IOPAMIDOL (ISOVUE-370) INJECTION 76%
INTRAVENOUS | Status: AC
  Filled 2016-12-04: qty 125

## 2016-12-04 MED ORDER — LEVALBUTEROL HCL 1.25 MG/0.5ML IN NEBU
1.2500 mg | INHALATION_SOLUTION | Freq: Three times a day (TID) | RESPIRATORY_TRACT | Status: DC
  Administered 2016-12-04 – 2016-12-05 (×4): 1.25 mg via RESPIRATORY_TRACT
  Filled 2016-12-04 (×4): qty 0.5

## 2016-12-04 MED ORDER — SODIUM CHLORIDE 0.9 % IV SOLN: 250.0000 mL | INTRAVENOUS | Status: DC | PRN

## 2016-12-04 MED ORDER — TIROFIBAN (AGGRASTAT) BOLUS VIA INFUSION
25.0000 ug/kg | Freq: Once | INTRAVENOUS | Status: AC
  Administered 2016-12-04: 2690 ug via INTRAVENOUS
  Filled 2016-12-04 (×2): qty 54

## 2016-12-04 MED ORDER — ASPIRIN 81 MG PO CHEW
324.0000 mg | CHEWABLE_TABLET | ORAL | Status: AC
  Administered 2016-12-04: 324 mg via ORAL
  Filled 2016-12-04: qty 4

## 2016-12-04 MED ORDER — ASPIRIN EC 81 MG PO TBEC: 81.0000 mg | DELAYED_RELEASE_TABLET | Freq: Every day | ORAL | Status: DC

## 2016-12-04 MED ORDER — SODIUM CHLORIDE 0.9 % IV SOLN
INTRAVENOUS | Status: DC
  Administered 2016-12-04: 21:00:00 via INTRAVENOUS

## 2016-12-04 MED ORDER — SODIUM CHLORIDE 0.9 % IV SOLN: INTRAVENOUS | Status: DC

## 2016-12-04 MED ORDER — LIDOCAINE HCL 2 % IJ SOLN
INTRAMUSCULAR | Status: AC
  Filled 2016-12-04: qty 20

## 2016-12-04 MED ORDER — LIDOCAINE HCL 2 % IJ SOLN
INTRAMUSCULAR | Status: DC | PRN
  Administered 2016-12-04 (×2): 15 mL

## 2016-12-04 MED ORDER — SACUBITRIL-VALSARTAN 24-26 MG PO TABS
1.0000 | ORAL_TABLET | Freq: Two times a day (BID) | ORAL | Status: DC
  Administered 2016-12-04: 1 via ORAL
  Filled 2016-12-04 (×2): qty 1

## 2016-12-04 MED ORDER — IOPAMIDOL (ISOVUE-370) INJECTION 76%
INTRAVENOUS | Status: AC
  Filled 2016-12-04: qty 50

## 2016-12-04 MED ORDER — ACTIVE PARTNERSHIP FOR HEALTH OF YOUR HEART BOOK
Freq: Once | Status: DC
  Filled 2016-12-04: qty 1

## 2016-12-04 MED ORDER — FUROSEMIDE 10 MG/ML IJ SOLN
40.0000 mg | Freq: Two times a day (BID) | INTRAMUSCULAR | Status: DC
  Administered 2016-12-04: 40 mg via INTRAVENOUS
  Filled 2016-12-04: qty 4

## 2016-12-04 MED ORDER — POTASSIUM CHLORIDE CRYS ER 20 MEQ PO TBCR
40.0000 meq | EXTENDED_RELEASE_TABLET | Freq: Once | ORAL | Status: AC
  Administered 2016-12-04: 40 meq via ORAL

## 2016-12-04 MED ORDER — FUROSEMIDE 10 MG/ML IJ SOLN
60.0000 mg | Freq: Once | INTRAMUSCULAR | Status: AC
  Administered 2016-12-04: 60 mg via INTRAVENOUS
  Filled 2016-12-04: qty 6

## 2016-12-04 MED ORDER — SODIUM CHLORIDE 0.9% FLUSH: 3.0000 mL | INTRAVENOUS | Status: DC | PRN

## 2016-12-04 MED ORDER — NITROGLYCERIN IN D5W 200-5 MCG/ML-% IV SOLN
0.0000 ug/min | INTRAVENOUS | Status: DC
  Administered 2016-12-04: 5 ug/min via INTRAVENOUS
  Filled 2016-12-04: qty 250

## 2016-12-04 MED ORDER — BIVALIRUDIN TRIFLUOROACETATE 250 MG IV SOLR
INTRAVENOUS | Status: AC
  Filled 2016-12-04: qty 250

## 2016-12-04 MED ORDER — SODIUM CHLORIDE 0.9 % IV SOLN
INTRAVENOUS | Status: AC | PRN
  Administered 2016-12-04: 1.75 mg/kg/h
  Administered 2016-12-04: 1.75 mg/kg/h via INTRAVENOUS

## 2016-12-04 MED ORDER — MIDAZOLAM HCL 2 MG/2ML IJ SOLN
INTRAMUSCULAR | Status: DC | PRN
  Administered 2016-12-04: 1 mg via INTRAVENOUS

## 2016-12-04 MED ORDER — TIROFIBAN HCL IN NACL 5-0.9 MG/100ML-% IV SOLN
0.1500 ug/kg/min | INTRAVENOUS | Status: DC
  Administered 2016-12-04 (×2): 0.15 ug/kg/min via INTRAVENOUS
  Filled 2016-12-04: qty 100

## 2016-12-04 MED ORDER — ONDANSETRON HCL 4 MG/2ML IJ SOLN: 4.0000 mg | Freq: Four times a day (QID) | INTRAMUSCULAR | Status: DC | PRN

## 2016-12-04 MED ORDER — TIROFIBAN HCL IN NACL 5-0.9 MG/100ML-% IV SOLN
INTRAVENOUS | Status: AC
  Filled 2016-12-04: qty 100

## 2016-12-04 MED ORDER — INSULIN ASPART 100 UNIT/ML ~~LOC~~ SOLN
0.0000 [IU] | Freq: Three times a day (TID) | SUBCUTANEOUS | Status: DC
  Administered 2016-12-04: 2 [IU] via SUBCUTANEOUS
  Administered 2016-12-04 – 2016-12-05 (×3): 1 [IU] via SUBCUTANEOUS

## 2016-12-04 MED ORDER — HEPARIN (PORCINE) IN NACL 2-0.9 UNIT/ML-% IJ SOLN
INTRAMUSCULAR | Status: AC
  Filled 2016-12-04: qty 1500

## 2016-12-04 MED ORDER — MIDAZOLAM HCL 2 MG/2ML IJ SOLN
INTRAMUSCULAR | Status: AC
  Filled 2016-12-04: qty 2

## 2016-12-04 MED ORDER — ALBUTEROL (5 MG/ML) CONTINUOUS INHALATION SOLN
10.0000 mg/h | INHALATION_SOLUTION | Freq: Once | RESPIRATORY_TRACT | Status: AC
  Administered 2016-12-04: 10 mg/h via RESPIRATORY_TRACT

## 2016-12-04 MED ORDER — HEPARIN BOLUS VIA INFUSION
4000.0000 [IU] | Freq: Once | INTRAVENOUS | Status: AC
  Administered 2016-12-04: 4000 [IU] via INTRAVENOUS
  Filled 2016-12-04: qty 4000

## 2016-12-04 MED ORDER — CARVEDILOL 3.125 MG PO TABS
3.1250 mg | ORAL_TABLET | Freq: Two times a day (BID) | ORAL | Status: DC
  Administered 2016-12-04 – 2016-12-05 (×2): 3.125 mg via ORAL
  Filled 2016-12-04 (×2): qty 1

## 2016-12-04 MED ORDER — ASPIRIN 300 MG RE SUPP: 300.0000 mg | RECTAL | Status: AC

## 2016-12-04 MED ORDER — ACETAMINOPHEN 325 MG PO TABS: 650.0000 mg | ORAL_TABLET | ORAL | Status: DC | PRN

## 2016-12-04 MED ORDER — BIVALIRUDIN BOLUS VIA INFUSION - CUPID
INTRAVENOUS | Status: DC | PRN
  Administered 2016-12-04: 80.7 mg via INTRAVENOUS

## 2016-12-04 MED ORDER — MORPHINE SULFATE (PF) 4 MG/ML IV SOLN
4.0000 mg | Freq: Once | INTRAVENOUS | Status: AC
  Administered 2016-12-04: 4 mg via INTRAVENOUS
  Filled 2016-12-04: qty 1

## 2016-12-04 MED ORDER — PANTOPRAZOLE SODIUM 40 MG PO TBEC
40.0000 mg | DELAYED_RELEASE_TABLET | Freq: Every day | ORAL | Status: DC
  Administered 2016-12-04 – 2016-12-05 (×2): 40 mg via ORAL
  Filled 2016-12-04 (×2): qty 1

## 2016-12-04 MED ORDER — IPRATROPIUM BROMIDE 0.02 % IN SOLN
0.5000 mg | Freq: Once | RESPIRATORY_TRACT | Status: AC
  Administered 2016-12-04: 0.5 mg via RESPIRATORY_TRACT
  Filled 2016-12-04: qty 2.5

## 2016-12-04 MED ORDER — NITROGLYCERIN 0.4 MG SL SUBL: 0.4000 mg | SUBLINGUAL_TABLET | SUBLINGUAL | Status: DC | PRN

## 2016-12-04 MED ORDER — HEPARIN (PORCINE) IN NACL 2-0.9 UNIT/ML-% IJ SOLN
INTRAMUSCULAR | Status: DC | PRN
Start: 2016-12-04 — End: 2016-12-04
  Administered 2016-12-04: 22:00:00

## 2016-12-04 MED ORDER — PERFLUTREN LIPID MICROSPHERE
1.0000 mL | INTRAVENOUS | Status: AC | PRN
  Administered 2016-12-04: 3 mL via INTRAVENOUS
  Filled 2016-12-04 (×2): qty 10

## 2016-12-04 MED ORDER — CARVEDILOL 6.25 MG PO TABS: 6.2500 mg | ORAL_TABLET | Freq: Two times a day (BID) | ORAL | Status: DC

## 2016-12-04 MED ORDER — FENTANYL CITRATE (PF) 100 MCG/2ML IJ SOLN
INTRAMUSCULAR | Status: AC
  Filled 2016-12-04: qty 2

## 2016-12-04 SURGICAL SUPPLY — 29 items
BALLN IABP SENSA PLUS 8F 50CC (BALLOONS) ×2
BALLN SAPPHIRE 2.5X15 (BALLOONS) ×2
BALLOON IABP SENS PLUS 8F 50CC (BALLOONS) IMPLANT
BALLOON SAPPHIRE 2.5X15 (BALLOONS) IMPLANT
CATH HEARTRAIL 6F IL4.0 (CATHETERS) ×1 IMPLANT
CATH INFINITI 5FR AL1 (CATHETERS) ×1 IMPLANT
CATH INFINITI 5FR MULTPACK ANG (CATHETERS) ×1 IMPLANT
CATH LAUNCHER 6FR AL1 (CATHETERS) IMPLANT
CATH LAUNCHER 6FR AL2 (CATHETERS) IMPLANT
CATH LAUNCHER 6FR EBU 4 (CATHETERS) ×1 IMPLANT
CATH VISTA GUIDE 6FR JL4.5 (CATHETERS) ×1 IMPLANT
CATH VISTA GUIDE 6FR XB3.5 (CATHETERS) ×1 IMPLANT
CATH VISTA GUIDE 6FR XB4 (CATHETERS) ×1 IMPLANT
CATH VISTA GUIDE 6FR XB4.5 (CATHETERS) ×1 IMPLANT
CATH VISTA GUIDE 6FR XBLAD4 (CATHETERS) ×1 IMPLANT
CATHETER LAUNCHER 6FR AL1 (CATHETERS) ×2
CATHETER LAUNCHER 6FR AL2 (CATHETERS) ×2
ELECT DEFIB PAD ADLT CADENCE (PAD) ×1 IMPLANT
GUIDE CATH RUNWAY 6FR FL5 (CATHETERS) ×1 IMPLANT
HOVERMATT SINGLE USE (MISCELLANEOUS) ×1 IMPLANT
KIT ENCORE 26 ADVANTAGE (KITS) ×2 IMPLANT
KIT HEART LEFT (KITS) ×2 IMPLANT
PACK CARDIAC CATHETERIZATION (CUSTOM PROCEDURE TRAY) ×2 IMPLANT
SHEATH PINNACLE 6F 10CM (SHEATH) ×2 IMPLANT
TRANSDUCER W/STOPCOCK (MISCELLANEOUS) ×2 IMPLANT
TUBING CIL FLEX 10 FLL-RA (TUBING) ×2 IMPLANT
WIRE EMERALD 3MM-J .035X150CM (WIRE) ×1 IMPLANT
WIRE PT2 MS 185 (WIRE) ×1 IMPLANT
WIRE RUNTHROUGH .014X180CM (WIRE) ×2 IMPLANT

## 2016-12-04 NOTE — ED Provider Notes (Signed)
[facility] EMERGENCY DEPARTMENT Provider Note   CSN: [id number] Arrival date & time: 06-Dec-2016  4098  Level 5 caveat for urgent need for intervention.   History   Chief Complaint Chief Complaint  Patient presents with  . Chest Pain  . Shortness of Breath    HPI Eric Benson is a 61 y.o. male.  HPI patient indicates he was fine earlier today, October 28.  EMS reports about 3 AM he had acute onset of shortness of breath and central chest pain.  They report he was diaphoretic.  He was given aspirin 324 mg orally and nitroglycerin sublingual x2.  He denies nausea or vomiting.  Patient has a cardiac history and he had a MI he states about 9 years ago.  He states it was similar to what he is having tonight.  PCP Dr Roanna Epley  Past Medical History:  Diagnosis Date  . Hypertension   . MI (myocardial infarction)     There are no active problems to display for this patient.   Past Surgical History:  Procedure Laterality Date  . CORONARY ANGIOPLASTY WITH STENT PLACEMENT         Home Medications    Prior to Admission medications   Medication Sig Start Date End Date Taking? Authorizing Provider  albuterol (PROVENTIL HFA;VENTOLIN HFA) 108 (90 Base) MCG/ACT inhaler Inhale 2 puffs into the lungs every 6 (six) hours as needed for wheezing or shortness of breath. 08/19/15   Casey Burkitt, MD  doxycycline (VIBRAMYCIN) 50 MG capsule Take 2 capsules (100 mg total) by mouth 2 (two) times daily. 08/19/15   Casey Burkitt, MD  HYDROcodone-homatropine [facility]) 5-1.5 MG/5ML syrup Take 5 mLs by mouth every 6 (six) hours as needed for cough. 08/19/15   Casey Burkitt, MD  predniSONE (DELTASONE) 20 MG tablet Take 40 mg daily with food x 5 days. 08/19/15   Casey Burkitt, MD    Family History No family history on file.  Social History Social History  Substance Use Topics  . Smoking status: Current Every Day Smoker  . Smokeless  tobacco: Not on file  . Alcohol use No  lives at home Lives with spouse smokes   Allergies   Patient has no allergy information on record.   Review of Systems Review of Systems  Unable to perform ROS: Severe respiratory distress     Physical Exam ED Triage Vitals  Enc Vitals Group     BP Dec 06, 2016 0452 (!) 188/175     Pulse Rate 2016/12/06 0508 (!) 114     Resp 12/06/16 0452 (!) 32     Temp --      Temp src --      SpO2 12-06-2016 0456 97 %     Weight --      Height --      Head Circumference --      Peak Flow --      Pain Score --      Pain Loc --      Pain Edu? --      Excl. in GC? --     Vital signs normal except for hypertension, tachycardia, and tachypnea   Physical Exam  Constitutional: He is oriented to person, place, and time. He appears well-developed and well-nourished.  Non-toxic appearance. He does not appear ill. He appears distressed.  HENT:  Head: Normocephalic and atraumatic.  Right Ear: External ear normal.  Left Ear: External ear normal.  Nose: Nose normal. No  mucosal edema or rhinorrhea.  Mouth/Throat: Oropharynx is clear and moist and mucous membranes are normal. No dental abscesses or uvula swelling.  Eyes: Pupils are equal, round, and reactive to light. Conjunctivae and EOM are normal.  Neck: Normal range of motion and full passive range of motion without pain. Neck supple.  Cardiovascular: Regular rhythm and normal heart sounds.  Tachycardia present.  Exam reveals no gallop and no friction rub.   No murmur heard. Pulmonary/Chest: Accessory muscle usage present. Tachypnea noted. He is in respiratory distress. He has decreased breath sounds. He has no wheezes. He has no rhonchi. He has no rales. He exhibits no tenderness and no crepitus.  Abdominal: Soft. Normal appearance and bowel sounds are normal. He exhibits no distension. There is no tenderness. There is no rebound and no guarding.  Musculoskeletal: Normal range of motion. He exhibits no  edema or tenderness.  Moves all extremities well.   Neurological: He is alert and oriented to person, place, and time. He has normal strength. No cranial nerve deficit.  Skin: Skin is intact. No rash noted. He is diaphoretic. No erythema. No pallor.  Skin is cold to touch  Psychiatric: He has a normal mood and affect. His speech is normal and behavior is normal. His mood appears not anxious.  Nursing note and vitals reviewed.    ED Treatments / Results  Labs (all labs ordered are listed, but only abnormal results are displayed) Results for orders placed or performed during the hospital encounter of 02/19/16  CBC with Differential/Platelet  Result Value Ref Range   WBC 11.0 (H) 4.0 - 10.5 K/uL   RBC 5.08 4.22 - 5.81 MIL/uL   Hemoglobin 16.0 13.0 - 17.0 g/dL   HCT 57.848.0 46.939.0 - 62.952.0 %   MCV 94.5 78.0 - 100.0 fL   MCH 31.5 26.0 - 34.0 pg   MCHC 33.3 30.0 - 36.0 g/dL   RDW 52.814.1 41.311.5 - 24.415.5 %   Platelets 178 150 - 400 K/uL   Neutrophils Relative % 35 %   Lymphocytes Relative 57 %   Monocytes Relative 5 %   Eosinophils Relative 2 %   Basophils Relative 1 %   Neutro Abs 3.9 1.7 - 7.7 K/uL   Lymphs Abs 6.2 (H) 0.7 - 4.0 K/uL   Monocytes Absolute 0.6 0.1 - 1.0 K/uL   Eosinophils Absolute 0.2 0.0 - 0.7 K/uL   Basophils Absolute 0.1 0.0 - 0.1 K/uL   WBC Morphology ATYPICAL LYMPHOCYTES   Comprehensive metabolic panel  Result Value Ref Range   Sodium 137 135 - 145 mmol/L   Potassium 4.1 3.5 - 5.1 mmol/L   Chloride 104 101 - 111 mmol/L   CO2 21 (L) 22 - 32 mmol/L   Glucose, Bld 183 (H) 65 - 99 mg/dL   BUN 8 6 - 20 mg/dL   Creatinine, Ser 0.101.30 (H) 0.61 - 1.24 mg/dL   Calcium 8.7 (L) 8.9 - 10.3 mg/dL   Total Protein 6.6 6.5 - 8.1 g/dL   Albumin 3.7 3.5 - 5.0 g/dL   AST 29 15 - 41 U/L   ALT 15 (L) 17 - 63 U/L   Alkaline Phosphatase 56 38 - 126 U/L   Total Bilirubin 0.8 0.3 - 1.2 mg/dL   GFR calc non Af Amer 58 (L) >60 mL/min   GFR calc Af Amer >60 >60 mL/min   Anion gap 12 5 - 15    Troponin I  Result Value Ref Range   Troponin I 0.04 (HH) <  0.03 ng/mL  Brain natriuretic peptide  Result Value Ref Range   B Natriuretic Peptide 2,744.8 (H) 0.0 - 100.0 pg/mL  I-stat chem 8, ed  Result Value Ref Range   Sodium 138 135 - 145 mmol/L   Potassium 4.0 3.5 - 5.1 mmol/L   Chloride 102 101 - 111 mmol/L   BUN 8 6 - 20 mg/dL   Creatinine, Ser 7.42 0.61 - 1.24 mg/dL   Glucose, Bld 595 (H) 65 - 99 mg/dL   Calcium, Ion 6.38 (L) 1.15 - 1.40 mmol/L   TCO2 23 22 - 32 mmol/L   Hemoglobin 16.7 13.0 - 17.0 g/dL   HCT 75.6 43.3 - 29.5 %  I-stat troponin, ED  Result Value Ref Range   Troponin i, poc 0.06 0.00 - 0.08 ng/mL   Comment 3          CBG monitoring, ED  Result Value Ref Range   Glucose-Capillary 171 (H) 65 - 99 mg/dL   Laboratory interpretation all normal except leukocytosis, very elevated BNP, borderline + Troponin, delta troponin pending    EKG  EKG Interpretation  Date/Time:  Monday December 04 2016 04:48:44 EDT Ventricular Rate:  120 PR Interval:    QRS Duration: 105 QT Interval:  383 QTC Calculation: 542 R Axis:   88 Text Interpretation:  Ectopic atrial tachycardia, unifocal Paired ventricular premature complexes Abnormal lateral Q waves Anteroseptal infarct, age indeterminate Prolonged QT interval Since last tracing 19 Aug 2015 heart rate is faster IVCD is more pronounced ST now depressed in lateral leads Confirmed by Devoria Albe ([id number]) on 11/16/2016 5:54:29 AM       Radiology Dg Chest Portable 1 View  Result Date: 11/27/2016 CLINICAL DATA:  Substernal chest pain and shortness of breath. EXAM: PORTABLE CHEST 1 VIEW COMPARISON:  Radiograph 08/19/2015 FINDINGS: Stable cardiomegaly and mediastinal contours. Atherosclerosis of the aortic arch. Diffuse pulmonary edema and Kerley B-lines. Minimal fluid in the right minor fissure. No focal airspace disease. No large subpulmonic effusion. No pneumothorax. Grossly stable osseous structures. IMPRESSION: Moderate  pulmonary edema.  Unchanged cardiomegaly. Electronically Signed   By: Rubye Oaks M.D.   On: 11/13/2016 05:05    Procedures .Critical Care Performed by: Lynelle Doctor Denarius Sesler Authorized by: Devoria Albe   Critical care provider statement:    Critical care time (minutes):  40   Critical care was necessary to treat or prevent imminent or life-threatening deterioration of the following conditions:  Cardiac failure and respiratory failure   Critical care was time spent personally by me on the following activities:  Discussions with consultants, evaluation of patient's response to treatment, examination of patient, obtaining history from patient or surrogate, ordering and review of laboratory studies, ordering and review of radiographic studies, ordering and performing treatments and interventions, pulse oximetry, re-evaluation of patient's condition and review of old charts    (including critical care time)  Medications Ordered in ED Medications  furosemide (LASIX) injection 60 mg (60 mg Intravenous Given 11/30/2016 0500)  albuterol (PROVENTIL,VENTOLIN) solution continuous neb (10 mg/hr Nebulization Given 11/27/2016 0500)  ipratropium (ATROVENT) nebulizer solution 0.5 mg (0.5 mg Nebulization Given 11/07/2016 0515)  methylPREDNISolone sodium succinate (SOLU-MEDROL) 125 mg/2 mL injection 125 mg (125 mg Intravenous Given 12/01/2016 0500)  morphine 4 MG/ML injection 4 mg (4 mg Intravenous Given 11/08/2016 0500)  nitroGLYCERIN (NITROGLYN) 2 % ointment 1 inch (1 inch Topical Given 12/04/16 0500)     Initial Impression / Assessment and Plan / ED Course  I have reviewed the triage vital signs and  the nursing notes.  Pertinent labs & imaging results that were available during my care of the patient were reviewed by me and considered in my medical decision making (see chart for details).     When patient arrived it was not clear to me whether he was having acute congestive heart failure or COPD exacerbation or  acute myocardial infarction although his EKG is not suggestive of that.  He was given Lasix 60 mg IV and given morphine 4 mg IV and nitroglycerin ointment 1 inch.  He was placed on BiPAP due to his respiratory difficulty and given an albuterol 10 mg plus Atrovent 0.5 mg continuous nebulizer.  When I review his chart he has been on albuterol inhaler and steroids in the past.  5:10 AM pt seems to be improving, he is on bipap and getting his albuterol/atrovent continuous nebulizer. Nursing staff placing urinary catheter.   05:40 am patient is still on BiPAP, his blood pressure is 98 systolic.  I talked to his nurse about removing his nitroglycerin paste if his blood pressure gets any lower.  He states his chest pain is gone, his breathing seems much improved.  His skin is now warm and dry.  06:30 AM patient doesn't have any urine in his bag, nursing staff is going to place a foley catheter.   Delta troponin was ordered to be done at 7 AM.  06:34 AM Dr Sharyn Lull, will admit.   Final Clinical Impressions(s) / ED Diagnoses   Final diagnoses:  Acute congestive heart failure, unspecified heart failure type (HCC)  Acute respiratory distress  Chest pain, unspecified type    Plan admission  Devoria Albe, MD, Concha Pyo, MD 11/10/2016 [phone]

## 2016-12-04 NOTE — Progress Notes (Signed)
  Echocardiogram 2D Echocardiogram has been performed.  Eric Benson 30-Sep-2016, 2:33 PM

## 2016-12-04 NOTE — Care Management Note (Signed)
Case Management Note  Patient Details  Name: Eric Benson MRN: [id number] Date of Birth: Aug 17, 1955  Subjective/Objective:     CM following for progression and d/c planning.  Not[facility].                Action/Plan: 12-10-2016 Noted CM consult, will follow for progression and discuss HH needs with pt at a later time as pt adm today. Pt not eligible for [facility] .   Expected Discharge Date:  11/07/16               Expected Discharge Plan:  Home w Home Health Services  In-House Referral:  NA  Discharge planning Services  CM Consult  Post Acute Care Choice:    Choice offered to:  Patient  DME Arranged:    DME Agency:     HH Arranged:    HH Agency:     Status of Service:  In process, will continue to follow  If discussed at Long Length of Stay Meetings, dates discussed:    Additional Comments:  Starlyn SkeansRoyal, Cauy Melody U, RN 12-10-2016, 1:10 PM

## 2016-12-04 NOTE — ED Notes (Signed)
Pt removed from bi-pap and placed on 6L [place] per Dr Sharyn LullHarwani, rr 22, O2 sats 97%.

## 2016-12-04 NOTE — Progress Notes (Signed)
ANTICOAGULATION CONSULT NOTE - Initial Consult  Pharmacy Consult for heparin Indication: chest pain/ACS  Allergies  Allergen Reactions  . Aldactone [Spironolactone]     Patient was affected by added breast tissue from this drug eight years ago    Patient Measurements: Height: 5\' 11"  (180.3 cm) Weight: 237 lb 3.4 oz (107.6 kg) IBW/kg (Calculated) : 75.3 Heparin Dosing Weight: 98kg  Vital Signs: Temp: 97.6 F (36.4 C) (10/29 1000) Temp Source: Oral (10/29 1000) BP: 119/91 (10/29 0915) Pulse Rate: 95 (10/29 0915)  Labs:  Recent Labs  04-27-16 0455 04-27-16 0503 04-27-16 0755  HGB 16.0 16.7  --   HCT 48.0 49.0  --   PLT 178  --   --   CREATININE 1.30* 1.20  --   TROPONINI 0.04*  --  0.23*    Estimated Creatinine Clearance: 80.6 mL/min (by C-G formula based on SCr of 1.2 mg/dL).   Medical History: Past Medical History:  Diagnosis Date  . Hypertension   . MI (myocardial infarction) [facility](HCC)    Assessment: 61 year old male with past medical history significant for coronary artery disease admitted for rule out MI. New orders received to start IV heparin, CE's low positive. He is not on anticoagulants prior to admit.   Goal of Therapy:  Heparin level 0.3-0.7 units/ml Monitor platelets by anticoagulation protocol: Yes   Plan:  Give 4000 units bolus x 1 Start heparin infusion at 1400 units/hr Check anti-Xa level in 6 hours and daily while on heparin Continue to monitor H&H and platelets  Sheppard CoilFrank Spencer Cardinal PharmD., BCPS Clinical Pharmacist Pager [phone] Aug 11, 2016 10:23 AM

## 2016-12-04 NOTE — Progress Notes (Signed)
CRITICAL VALUE ALERT  Critical Value:  Troponin 19.91   Date & Time Notied:  08/03/2016 @ 17.51  Provider Notified: Dr. Sharyn LullHarwani  Orders Received/Actions taken: No orders received

## 2016-12-04 NOTE — Progress Notes (Signed)
Called Dr. Sharyn LullHarwani after assessing patient. 5/10 CP, diaphoretic, and BP 125/99. Patient claims CP like this all day and BP running this high all day. Verbal orders for stat EKG, can titrate nitroglycerin gtt up to 20 mcg/min, and aggrastat gtt stat. Will initiate orders and continue to monitor patient.

## 2016-12-04 NOTE — ED Notes (Signed)
Dr Sharyn LullHarwani notified of elevating troponin

## 2016-12-04 NOTE — ED Triage Notes (Signed)
Pt from home via [facility]. Pt c/o substernal CP w/ SHOB & diaphoresis that started @ 0300. Rates pain @ 9/10. Given 2 NTG & 324 ASA PTA by [facility]. Pt clammy, diaphoretic & struggling to breathe on NRB. EDP @ bedside.

## 2016-12-04 NOTE — Progress Notes (Addendum)
ANTICOAGULATION CONSULT NOTE  Pharmacy Consult for heparin Indication: chest pain/ACS  Allergies  Allergen Reactions  . Aldactone [Spironolactone]     Patient was affected by added breast tissue from this drug eight years ago    Patient Measurements: Height: 5\' 11"  (180.3 cm) Weight: 237 lb 3.4 oz (107.6 kg) IBW/kg (Calculated) : 75.3 Heparin Dosing Weight: 98kg  Vital Signs: Temp: 97.2 F (36.2 C) (10/29 1643) Temp Source: Oral (10/29 1643) BP: 121/99 (10/29 1700) Pulse Rate: 96 (10/29 1700)  Labs:  Recent Labs  03/12/16 0455 03/12/16 0503 03/12/16 0755 03/12/16 1414 03/12/16 1801  HGB 16.0 16.7  --   --   --   HCT 48.0 49.0  --   --   --   PLT 178  --   --   --   --   LABPROT  --   --   --   --  13.8  INR  --   --   --   --  1.07  HEPARINUNFRC  --   --   --   --  0.64  CREATININE 1.30* 1.20  --   --   --   TROPONINI 0.04*  --  0.23* 19.91* >65.00*    Estimated Creatinine Clearance: 80.6 mL/min (by C-G formula based on SCr of 1.2 mg/dL).   Assessment: 61 y/o male with hx CAD admitted with chest pain and started on IV heparin. Troponin is elevated and >65.   Heparin level is therapeutic at 0.64 on 1400 units/hr. No bleeding noted.  Goal of Therapy:  Heparin level 0.3-0.7 units/ml Monitor platelets by anticoagulation protocol: Yes   Plan:  Continue heparin drip at 1400 units/hr Confirmatory heparin level at midnight Daily heparin level and CBC Monitor for s/sx of bleeding F/U plan   Loura BackJennifer [place], PharmD, BCPS Clinical Pharmacist Phone for today [phone] Main pharmacy - [phone] 09/27/16 7:12 PM    Addendum: Tirofiban added. Adjust heparin goal to 0.3-0.5 until discontinued.  Decrease heparin drip to 1350 units/hr.   Loura BackJennifer McMec[place], PharmD, BCPS Clinical Pharmacist 09/27/16 8:45 PM

## 2016-12-04 NOTE — H&P (Signed)
Eric Benson is an 61 y.o. male.   Chief Complaint: Acute onset of shortness of breath associated with retrosternal chest pressure HPI: Patient is 61 year old male with past medical history significant for coronary artery disease history of non-Q-wave myocardial infarction approximately 9 years ago requiring PCI to LAD, ischemic cardiomyopathy EF approximately 20-30% recently hypertension, diabetes mellitus, hyperlipidemia, morbid obesity, tobacco abuse, which abuse, came to the ER by EMS because of sudden onset of shortness of breath associated with localized retrosternal chest pain and diaphoresis. Patient received aspirin and sublingual nitroglycerin IV Lasix and breathing treatment and was placed on BiPAP with improvement in his symptoms. Patient presently complains of vague dull aching retrosternal chest pain states overall feels much better patient is off BiPAP now on nasal cannula maintaining sats in 90% range. Patient denies any excessive salty food intake. Denies any fever or chills. Denies cough sore throat. Denies noncompliance to medication. Patient has been discussed multiple times at length in the past regarding ICD but has refused.  Past Medical History:  Diagnosis Date  . Hypertension   . MI (myocardial infarction)     Past Surgical History:  Procedure Laterality Date  . CORONARY ANGIOPLASTY WITH STENT PLACEMENT      No family history on file. Social History:  reports that he has been smoking.  He does not have any smokeless tobacco history on file. He reports that he does not drink alcohol or use drugs.  Allergies: Not on File   (Not in a hospital admission)  Results for orders placed or performed during the hospital encounter of 11/29/2016 (from the past 48 hour(s))  CBC with Differential/Platelet     Status: Abnormal   Collection Time: 11/11/2016  4:55 AM  Result Value Ref Range   WBC 11.0 (H) 4.0 - 10.5 K/uL   RBC 5.08 4.22 - 5.81 MIL/uL   Hemoglobin 16.0 13.0 - 17.0  g/dL   HCT 48.0 39.0 - 52.0 %   MCV 94.5 78.0 - 100.0 fL   MCH 31.5 26.0 - 34.0 pg   MCHC 33.3 30.0 - 36.0 g/dL   RDW 14.1 11.5 - 15.5 %   Platelets 178 150 - 400 K/uL   Neutrophils Relative % 35 %   Lymphocytes Relative 57 %   Monocytes Relative 5 %   Eosinophils Relative 2 %   Basophils Relative 1 %   Neutro Abs 3.9 1.7 - 7.7 K/uL   Lymphs Abs 6.2 (H) 0.7 - 4.0 K/uL   Monocytes Absolute 0.6 0.1 - 1.0 K/uL   Eosinophils Absolute 0.2 0.0 - 0.7 K/uL   Basophils Absolute 0.1 0.0 - 0.1 K/uL   WBC Morphology ATYPICAL LYMPHOCYTES   Comprehensive metabolic panel     Status: Abnormal   Collection Time: 11/07/2016  4:55 AM  Result Value Ref Range   Sodium 137 135 - 145 mmol/L   Potassium 4.1 3.5 - 5.1 mmol/L   Chloride 104 101 - 111 mmol/L   CO2 21 (L) 22 - 32 mmol/L   Glucose, Bld 183 (H) 65 - 99 mg/dL   BUN 8 6 - 20 mg/dL   Creatinine, Ser 1.30 (H) 0.61 - 1.24 mg/dL   Calcium 8.7 (L) 8.9 - 10.3 mg/dL   Total Protein 6.6 6.5 - 8.1 g/dL   Albumin 3.7 3.5 - 5.0 g/dL   AST 29 15 - 41 U/L   ALT 15 (L) 17 - 63 U/L   Alkaline Phosphatase 56 38 - 126 U/L   Total Bilirubin 0.8  0.3 - 1.2 mg/dL   GFR calc non Af Amer 58 (L) >60 mL/min   GFR calc Af Amer >60 >60 mL/min    Comment: (NOTE) The eGFR has been calculated using the CKD EPI equation. This calculation has not been validated in all clinical situations. eGFR's persistently <60 mL/min signify possible Chronic Kidney Disease.    Anion gap 12 5 - 15  Troponin I     Status: Abnormal   Collection Time: 11/24/2016  4:55 AM  Result Value Ref Range   Troponin I 0.04 (HH) <0.03 ng/mL    Comment: CRITICAL RESULT CALLED TO, READ BACK BY AND VERIFIED WITH: CABLE M,RN 12/02/2016 0609 WAYK   Brain natriuretic peptide     Status: Abnormal   Collection Time: 11/28/2016  4:55 AM  Result Value Ref Range   B Natriuretic Peptide 2,744.8 (H) 0.0 - 100.0 pg/mL  CBG monitoring, ED     Status: Abnormal   Collection Time: 11/28/2016  4:55 AM  Result Value  Ref Range   Glucose-Capillary 171 (H) 65 - 99 mg/dL  I-stat troponin, ED     Status: None   Collection Time: 11/08/2016  5:02 AM  Result Value Ref Range   Troponin i, poc 0.06 0.00 - 0.08 ng/mL   Comment 3            Comment: Due to the release kinetics of cTnI, a negative result within the first hours of the onset of symptoms does not rule out myocardial infarction with certainty. If myocardial infarction is still suspected, repeat the test at appropriate intervals.   I-stat chem 8, ed     Status: Abnormal   Collection Time: 11/09/2016  5:03 AM  Result Value Ref Range   Sodium 138 135 - 145 mmol/L   Potassium 4.0 3.5 - 5.1 mmol/L   Chloride 102 101 - 111 mmol/L   BUN 8 6 - 20 mg/dL   Creatinine, Ser 1.20 0.61 - 1.24 mg/dL   Glucose, Bld 184 (H) 65 - 99 mg/dL   Calcium, Ion 1.12 (L) 1.15 - 1.40 mmol/L   TCO2 23 22 - 32 mmol/L   Hemoglobin 16.7 13.0 - 17.0 g/dL   HCT 49.0 39.0 - 52.0 %   Dg Chest Portable 1 View  Result Date: 11/12/2016 CLINICAL DATA:  Substernal chest pain and shortness of breath. EXAM: PORTABLE CHEST 1 VIEW COMPARISON:  Radiograph 08/19/2015 FINDINGS: Stable cardiomegaly and mediastinal contours. Atherosclerosis of the aortic arch. Diffuse pulmonary edema and Kerley B-lines. Minimal fluid in the right minor fissure. No focal airspace disease. No large subpulmonic effusion. No pneumothorax. Grossly stable osseous structures. IMPRESSION: Moderate pulmonary edema.  Unchanged cardiomegaly. Electronically Signed   By: Jeb Levering M.D.   On: 11/28/2016 05:05    Review of Systems  Constitutional: Positive for diaphoresis. Negative for fever.  Respiratory: Positive for shortness of breath.   Cardiovascular: Positive for chest pain. Negative for orthopnea, claudication and leg swelling.  Gastrointestinal: Negative for nausea and vomiting.  Genitourinary: Negative for dysuria.  Neurological: Negative for dizziness.    Blood pressure 100/82, pulse 89, resp. rate (!)  25, SpO2 100 %. Physical Exam  Constitutional: He is oriented to person, place, and time.  HENT:  Head: Normocephalic and atraumatic.  Eyes: Pupils are equal, round, and reactive to light. Conjunctivae are normal. Left eye exhibits no discharge. No scleral icterus.  Neck: Normal range of motion. Neck supple. JVD present. No thyromegaly present.  Cardiovascular: Normal rate and regular rhythm.  Murmur (2/6 systolic murmur and S3 gallop noted) heard. Respiratory:  Decreased breath sound at bases with faint rales noted  GI: Soft. Bowel sounds are normal. He exhibits no distension. There is no tenderness. There is no rebound.  Musculoskeletal: He exhibits no edema, tenderness or deformity.  Neurological: He is alert and oriented to person, place, and time.     Assessment/Plan Acute systolic congestive heart failure rule out ischemia rule out MI Acute coronary syndrome Coronary artery disease history of non-Q-wave MI in the past Ischemic dilated cardiomyopathy Status post hypertensive emergency Habitus mellitus Morbid obesity Hyperlipidemia History of tobacco abuse History of EtOH abuse in the past Plan As per orders  Charolette Forward, MD 11/06/2016, 8:05 AM

## 2016-12-04 NOTE — ED Notes (Signed)
Respiratory compromise noted and patient is struggling to breathe on a NRB on my arrival. Pt is now on NIV at this time tolerating It fairly well. Pt is diaphoretic, pt is speaking in broken sentences. Pt has labored respirations, BBS are tight decrease throughout all lung fields. Pt is c/o Chest Pain. RN aware/ MD aware.

## 2016-12-05 ENCOUNTER — Encounter (HOSPITAL_COMMUNITY): Payer: Self-pay | Admitting: Cardiology

## 2016-12-05 ENCOUNTER — Encounter (HOSPITAL_COMMUNITY): Admission: EM | Disposition: E | Payer: Self-pay | Source: Home / Self Care | Attending: Cardiology

## 2016-12-05 ENCOUNTER — Inpatient Hospital Stay (HOSPITAL_COMMUNITY): Payer: Medicare Other

## 2016-12-05 ENCOUNTER — Inpatient Hospital Stay (HOSPITAL_COMMUNITY): Payer: Medicare Other | Admitting: Certified Registered"

## 2016-12-05 ENCOUNTER — Other Ambulatory Visit: Payer: Self-pay

## 2016-12-05 DIAGNOSIS — R570 Cardiogenic shock: Secondary | ICD-10-CM

## 2016-12-05 DIAGNOSIS — I5021 Acute systolic (congestive) heart failure: Secondary | ICD-10-CM

## 2016-12-05 DIAGNOSIS — I509 Heart failure, unspecified: Secondary | ICD-10-CM

## 2016-12-05 DIAGNOSIS — I472 Ventricular tachycardia, unspecified: Secondary | ICD-10-CM

## 2016-12-05 DIAGNOSIS — I2102 ST elevation (STEMI) myocardial infarction involving left anterior descending coronary artery: Secondary | ICD-10-CM

## 2016-12-05 LAB — BASIC METABOLIC PANEL
ANION GAP: 9 (ref 5–15)
BUN: 24 mg/dL — AB (ref 6–20)
CHLORIDE: 105 mmol/L (ref 101–111)
CO2: 19 mmol/L — ABNORMAL LOW (ref 22–32)
Calcium: 8.4 mg/dL — ABNORMAL LOW (ref 8.9–10.3)
Creatinine, Ser: 1.23 mg/dL (ref 0.61–1.24)
GFR calc Af Amer: >60 mL/min (ref >60)
GFR calc non Af Amer: >60 mL/min (ref >60)
Glucose, Bld: 143 mg/dL — ABNORMAL HIGH (ref 65–99)
POTASSIUM: 4.1 mmol/L (ref 3.5–5.1)
SODIUM: 133 mmol/L — AB (ref 135–145)

## 2016-12-05 LAB — POCT I-STAT 3, ART BLOOD GAS (G3+)
Acid-base deficit: 6 mmol/L — ABNORMAL HIGH (ref 0.0–2.0)
Bicarbonate: 24.6 mmol/L (ref 20.0–28.0)
O2 SAT: 73 %
PCO2 ART: 77.1 mmHg — AB (ref 32.0–48.0)
PH ART: 7.112 — AB (ref 7.350–7.450)
TCO2: 27 mmol/L (ref 22–32)
pO2, Arterial: 53 mmHg — ABNORMAL LOW (ref 83.0–108.0)

## 2016-12-05 LAB — GLUCOSE, CAPILLARY
GLUCOSE-CAPILLARY: 140 mg/dL — AB (ref 65–99)
Glucose-Capillary: 129 mg/dL — ABNORMAL HIGH (ref 65–99)
Glucose-Capillary: 123 mg/dL — ABNORMAL HIGH (ref 65–99)

## 2016-12-05 LAB — POCT ACTIVATED CLOTTING TIME
ACTIVATED CLOTTING TIME: 472 s
ACTIVATED CLOTTING TIME: 230 s
ACTIVATED CLOTTING TIME: 202 s
ACTIVATED CLOTTING TIME: 175 s
ACTIVATED CLOTTING TIME: 158 s
Activated Clotting Time: 186 seconds

## 2016-12-05 LAB — COOXEMETRY PANEL
CARBOXYHEMOGLOBIN: 0.9 % (ref 0.5–1.5)
Carboxyhemoglobin: 0.8 % (ref 0.5–1.5)
METHEMOGLOBIN: 1 % (ref 0.0–1.5)
Methemoglobin: 1.2 % (ref 0.0–1.5)
O2 SAT: 46.8 %
O2 Saturation: 47.7 %
TOTAL HEMOGLOBIN: 12.3 g/dL (ref 12.0–16.0)
Total hemoglobin: 12.1 g/dL (ref 12.0–16.0)

## 2016-12-05 LAB — LIPID PANEL
CHOL/HDL RATIO: 3.1 ratio
CHOLESTEROL: 106 mg/dL (ref 0–200)
HDL: 34 mg/dL — AB (ref >40)
LDL Cholesterol: 55 mg/dL (ref 0–99)
Triglycerides: 85 mg/dL (ref <150)
VLDL: 17 mg/dL (ref 0–40)

## 2016-12-05 LAB — CBC
HCT: 36.9 % — ABNORMAL LOW (ref 39.0–52.0)
Hemoglobin: 12.8 g/dL — ABNORMAL LOW (ref 13.0–17.0)
MCH: 31 pg (ref 26.0–34.0)
MCHC: 34.1 g/dL (ref 30.0–36.0)
MCV: 90.9 fL (ref 78.0–100.0)
PLATELETS: 157 10*3/uL (ref 150–400)
RBC: 4.06 MIL/uL — AB (ref 4.22–5.81)
RDW: 14.3 % (ref 11.5–15.5)
WBC: 11.4 10*3/uL — ABNORMAL HIGH (ref 4.0–10.5)

## 2016-12-05 LAB — HIV ANTIBODY (ROUTINE TESTING W REFLEX): HIV Screen 4th Generation wRfx: NONREACTIVE

## 2016-12-05 LAB — TROPONIN I: Troponin I: >65 ng/mL (ref <0.03)

## 2016-12-05 SURGERY — LEFT HEART CATH AND CORONARY ANGIOGRAPHY
Anesthesia: LOCAL

## 2016-12-05 SURGERY — TEMPORARY PACEMAKER
Anesthesia: LOCAL

## 2016-12-05 MED ORDER — FENTANYL CITRATE (PF) 100 MCG/2ML IJ SOLN
INTRAMUSCULAR | Status: AC
  Filled 2016-12-05: qty 2

## 2016-12-05 MED ORDER — ATROPINE SULFATE 1 MG/10ML IJ SOSY
PREFILLED_SYRINGE | INTRAMUSCULAR | Status: AC
  Filled 2016-12-05: qty 10

## 2016-12-05 MED ORDER — SODIUM CHLORIDE 0.9% FLUSH
3.0000 mL | Freq: Two times a day (BID) | INTRAVENOUS | Status: DC
  Administered 2016-12-05: 3 mL via INTRAVENOUS

## 2016-12-05 MED ORDER — "THROMBI-PAD 3""X3"" EX PADS"
1.0000 | MEDICATED_PAD | Freq: Once | CUTANEOUS | Status: AC
  Administered 2016-12-05: 1 via TOPICAL
  Filled 2016-12-05: qty 1

## 2016-12-05 MED ORDER — PROPOFOL 10 MG/ML IV BOLUS
INTRAVENOUS | Status: DC | PRN
  Administered 2016-12-05: 100 mg via INTRAVENOUS

## 2016-12-05 MED ORDER — CEFAZOLIN SODIUM-DEXTROSE 2-4 GM/100ML-% IV SOLN
2.0000 g | INTRAVENOUS | Status: AC
  Administered 2016-12-05: 2 g via INTRAVENOUS
  Filled 2016-12-05 (×2): qty 100

## 2016-12-05 MED ORDER — SODIUM CHLORIDE 0.9% FLUSH: 3.0000 mL | INTRAVENOUS | Status: DC | PRN

## 2016-12-05 MED ORDER — DOPAMINE-DEXTROSE 3.2-5 MG/ML-% IV SOLN
INTRAVENOUS | Status: AC
  Filled 2016-12-05: qty 250

## 2016-12-05 MED ORDER — SODIUM CHLORIDE 0.9% FLUSH: 10.0000 mL | INTRAVENOUS | Status: DC | PRN

## 2016-12-05 MED ORDER — MORPHINE SULFATE (PF) 10 MG/ML IV SOLN: 1.0000 mg | INTRAVENOUS | Status: DC | PRN

## 2016-12-05 MED ORDER — PROPOFOL 500 MG/50ML IV EMUL
INTRAVENOUS | Status: AC
  Filled 2016-12-05: qty 50

## 2016-12-05 MED ORDER — DOPAMINE-DEXTROSE 3.2-5 MG/ML-% IV SOLN: 0.0000 ug/kg/min | INTRAVENOUS | Status: DC

## 2016-12-05 MED ORDER — ASPIRIN 81 MG PO CHEW
81.0000 mg | CHEWABLE_TABLET | Freq: Every day | ORAL | Status: DC
  Administered 2016-12-05: 81 mg via ORAL
  Filled 2016-12-05: qty 1

## 2016-12-05 MED ORDER — OXYCODONE-ACETAMINOPHEN 5-325 MG PO TABS: 1.0000 | ORAL_TABLET | Freq: Four times a day (QID) | ORAL | Status: DC | PRN

## 2016-12-05 MED ORDER — SODIUM CHLORIDE 0.9 % IV SOLN: 250.0000 mL | INTRAVENOUS | Status: DC | PRN

## 2016-12-05 MED ORDER — AMIODARONE HCL IN DEXTROSE 360-4.14 MG/200ML-% IV SOLN
INTRAVENOUS | Status: AC
  Filled 2016-12-05: qty 200

## 2016-12-05 MED ORDER — EPINEPHRINE PF 1 MG/10ML IJ SOSY
PREFILLED_SYRINGE | INTRAMUSCULAR | Status: AC
  Filled 2016-12-05: qty 30

## 2016-12-05 MED ORDER — FUROSEMIDE 10 MG/ML IJ SOLN
80.0000 mg | Freq: Once | INTRAMUSCULAR | Status: DC
Start: 2016-12-05 — End: 2016-12-05
  Filled 2016-12-05: qty 8

## 2016-12-05 MED ORDER — DIGOXIN 125 MCG PO TABS
0.1250 mg | ORAL_TABLET | Freq: Every day | ORAL | Status: DC
  Filled 2016-12-05: qty 1

## 2016-12-05 MED ORDER — HEPARIN (PORCINE) IN NACL 100-0.45 UNIT/ML-% IJ SOLN
1300.0000 [IU]/h | INTRAMUSCULAR | Status: DC
  Administered 2016-12-05: 1300 [IU]/h via INTRAVENOUS
  Filled 2016-12-05: qty 250

## 2016-12-05 MED ORDER — SUCCINYLCHOLINE CHLORIDE 20 MG/ML IJ SOLN
INTRAMUSCULAR | Status: DC | PRN
  Administered 2016-12-05: 100 mg via INTRAVENOUS

## 2016-12-05 MED ORDER — CHLORHEXIDINE GLUCONATE CLOTH 2 % EX PADS: 6.0000 | MEDICATED_PAD | Freq: Every day | CUTANEOUS | Status: DC

## 2016-12-05 MED ORDER — MILRINONE LACTATE IN DEXTROSE 20-5 MG/100ML-% IV SOLN
0.2500 ug/kg/min | INTRAVENOUS | Status: DC
  Filled 2016-12-05: qty 100

## 2016-12-05 MED ORDER — FUROSEMIDE 10 MG/ML IJ SOLN
80.0000 mg | Freq: Two times a day (BID) | INTRAMUSCULAR | Status: DC
  Administered 2016-12-05: 80 mg via INTRAVENOUS

## 2016-12-05 MED ORDER — MIDAZOLAM HCL 2 MG/2ML IJ SOLN
INTRAMUSCULAR | Status: AC
  Filled 2016-12-05: qty 4

## 2016-12-05 MED ORDER — NOREPINEPHRINE BITARTRATE 1 MG/ML IV SOLN
0.0000 ug/min | INTRAVENOUS | Status: DC
  Filled 2016-12-05: qty 4

## 2016-12-05 MED ORDER — SODIUM CHLORIDE 0.9% FLUSH: 10.0000 mL | Freq: Two times a day (BID) | INTRAVENOUS | Status: DC

## 2016-12-05 MED ORDER — CLOPIDOGREL BISULFATE 75 MG PO TABS
75.0000 mg | ORAL_TABLET | Freq: Every day | ORAL | Status: DC
  Administered 2016-12-05: 75 mg via ORAL
  Filled 2016-12-05: qty 1

## 2016-12-05 MED ORDER — MORPHINE SULFATE (PF) 4 MG/ML IV SOLN
1.0000 mg | INTRAVENOUS | Status: DC | PRN
  Administered 2016-12-05 (×2): 2 mg via INTRAVENOUS
  Filled 2016-12-05 (×2): qty 1

## 2016-12-05 MED ORDER — SODIUM CHLORIDE 0.9% FLUSH
10.0000 mL | Freq: Two times a day (BID) | INTRAVENOUS | Status: DC
  Administered 2016-12-05: 20 mL

## 2016-12-05 MED FILL — Medication: Qty: 1 | Status: AC

## 2016-12-07 ENCOUNTER — Telehealth: Payer: Self-pay | Admitting: Cardiology

## 2016-12-07 NOTE — Progress Notes (Signed)
ANTICOAGULATION CONSULT NOTE  Pharmacy Consult for heparin Indication: chest pain/ACS  Allergies  Allergen Reactions  . Aldactone [Spironolactone]     Patient was affected by added breast tissue from this drug eight years ago    Patient Measurements: Height: 5\' 11"  (180.3 cm) Weight: 233 lb 4 oz (105.8 kg) IBW/kg (Calculated) : 75.3 Heparin Dosing Weight: 98kg  Vital Signs: Temp: 98.4 F (36.9 C) (10/30 0305) Temp Source: Oral (10/30 0305) BP: 118/75 (10/30 0600) Pulse Rate: 84 (10/30 0600)  Labs:  Recent Labs  2017-02-04 0455 2017-02-04 0503  2017-02-04 1414 2017-02-04 1801 11/21/2016 0357  HGB 16.0 16.7  --   --   --  12.8*  HCT 48.0 49.0  --   --   --  36.9*  PLT 178  --   --   --   --  157  LABPROT  --   --   --   --  13.8  --   INR  --   --   --   --  1.07  --   HEPARINUNFRC  --   --   --   --  0.64  --   CREATININE 1.30* 1.20  --   --   --  1.23  TROPONINI 0.04*  --   < > 19.91* >65.00* >65.00*  < > = values in this interval not displayed.  Estimated Creatinine Clearance: 78.1 mL/min (by C-G formula based on SCr of 1.23 mg/dL).   Assessment: 61 y.o. male with ACS s/p cath, for heparin.   Found to have  proximal LAD in-stent restenosis which was unable to be intervened upon, and IABP was placed.  Heparin to start 8 hours after left femoral sheath removed  Goal of Therapy:  Heparin level 0.3-0.7 units/ml Monitor platelets by anticoagulation protocol: Yes   Plan:  F/U ACTs and sheath removal  Geannie RisenGreg Andrus Sharp, PharmD, BCPS  11/14/2016 6:29 AM

## 2016-12-07 NOTE — Progress Notes (Signed)
Subjective:  Called by the nurse as patient was noted to have elevated troponin I repeat EKG showednew Q waves with ST elevation in anterolateral leads suggestive of acute anterolateral wall MI code STEMI was called.   Objective:  Vital Signs in the last 24 hours: Temp:  [97.2 F (36.2 C)-97.9 F (36.6 C)] 97.9 F (36.6 C) (10/29 2032) Pulse Rate:  [47-114] 99 (10/29 2100) Resp:  [13-33] 29 (10/29 2100) BP: (95-188)/(70-175) 128/92 (10/29 2201) SpO2:  [91 %-100 %] 96 % (10/29 2144) FiO2 (%):  [60 %] 60 % (10/29 0738) Weight:  [107.6 kg (237 lb 3.4 oz)] 107.6 kg (237 lb 3.4 oz) (10/29 1000)  Intake/Output from previous day: 10/29 0701 - 10/30 0700 In: 699.9 [P.O.:540; I.V.:159.9] Out: 620 [Urine:620] Intake/Output from this shift: Total I/O In: 72.6 [I.V.:72.6] Out: 150 [Urine:150]  Physical Exam: Neck: no adenopathy, no carotid bruit, no JVD and supple, symmetrical, trachea midline Lungs: bibasilar rales noted Heart: regular rate and rhythm, S1, S2 normal and soft systolic murmur and S3 gallop noted Abdomen: soft, non-tender; bowel sounds normal; no masses,  no organomegaly Extremities: extremities normal, atraumatic, no cyanosis or edema  Lab Results:  Recent Labs  11/28/2016 0455 11/15/2016 0503  WBC 11.0*  --   HGB 16.0 16.7  PLT 178  --     Recent Labs  11/07/2016 0455 11/25/2016 0503  NA 137 138  K 4.1 4.0  CL 104 102  CO2 21*  --   GLUCOSE 183* 184*  BUN 8 8  CREATININE 1.30* 1.20    Recent Labs  11/28/2016 1414 11/16/2016 1801  TROPONINI 19.91* >65.00*   Hepatic Function Panel  Recent Labs  11/12/2016 0455  PROT 6.6  ALBUMIN 3.7  AST 29  ALT 15*  ALKPHOS 56  BILITOT 0.8   No results for input(s): CHOL in the last 72 hours. No results for input(s): PROTIME in the last 72 hours.  Imaging: Imaging results have been reviewed and Dg Chest Portable 1 View  Result Date: 11/12/2016 CLINICAL DATA:  Substernal chest pain and shortness of breath. EXAM:  PORTABLE CHEST 1 VIEW COMPARISON:  Radiograph 08/19/2015 FINDINGS: Stable cardiomegaly and mediastinal contours. Atherosclerosis of the aortic arch. Diffuse pulmonary edema and Kerley B-lines. Minimal fluid in the right minor fissure. No focal airspace disease. No large subpulmonic effusion. No pneumothorax. Grossly stable osseous structures. IMPRESSION: Moderate pulmonary edema.  Unchanged cardiomegaly. Electronically Signed   By: Rubye OaksMelanie  Ehinger M.D.   On: 11/14/2016 05:05    Cardiac Studies:  Assessment/Plan:  acute anterolateral wall MI Acute pulmonary edema Ischemic cardiomyopathy History of non-Q-wave MI in the past status post PCI to LAD approximately 9 years ago Hypertension New-onset diabetes mellitus hyperlipidemia Morbid obesity History of EtOH abuse History of tobacco abuse Plan Discussed with patient and family regarding new EKG findings and elevated enzymes and emergency left cardiac catheterization possible PTCA stenting/intra-aortic balloon pump insertion its risk and benefits I.e. Death MI stroke need for emergency CABG local vascular complications orsening renal function etc. And consents for PCI  LOS: 1 day    Rinaldo CloudHarwani, Diantha Paxson Jun 05, 2016, 12:52 AM

## 2016-12-07 NOTE — Progress Notes (Signed)
   Asked by nursing staff to assess EKG.   3rd degree heart block 40s. EKG completed.  Dr Shirlee LatchMcLean at bedside. Dopamine started but remained in 3rd degree heart bock with heart rate in 40s. Received atropine.   Developed acute respiratory distress. Intubated by anesthesia at 1405.    He then went in an out of VT requiring multiple shocks and CPR.  Started on epi drip and amio drip.   Dr Sharyn LullHarwani was notified and once he arrived he discussed dire situation with family.   He later expired 14:48.    Amy Clegg NP-C  2:59 PM

## 2016-12-07 NOTE — Consult Note (Signed)
Advanced Heart Failure Team Consult Note   Primary Physician: Primary Cardiologist:  Dr Sharyn Lull  Reason for Consultation: A/C Systolic Heart Failure   HPI:    Eric Benson is seen today for evaluation of A/C Systolic Heart Failure  at the request of Dr Sharyn Lull.   [name] is 61 year old with a history CAD, MI 2009 with PCI  to LAD, ICM, chronic systolic heart failure, HTN, DMII, hyperlipidemia, and smoker. In the past he has refused ICD. He has been disabled since 2009.   On Sunday he was in his usual state until 3 am when developed increased shortness of breath and chest pain. EMS called. Presented to [facility] ED via EMS due to increased shortness of breath and chest pain. ED course included asa, nitro, lasix, and bipap.  Troponin trend 0.23>19.9>>65>65. BNP 2744, creatinine 1.3, sodium 137, WBC 11.CXR on admit with pulmonary edema.  Last night had CP and went to cath lab as noted below with IABP inserted. See below.   Currently denies CP/SOB.    LHC 11/19/2016 with IABP insertion.   Prox RCA to Mid RCA lesion, 70 %stenosed.  Mid RCA to Dist RCA lesion, 75 %stenosed.  Mid Cx lesion, 70 %stenosed.  Ost 2nd Mrg to 2nd Mrg lesion, 70 %stenosed.  Ost LAD to Prox LAD lesion, 100 %stenosed.  Echo 11/18/2016 EF 15-20% Grade III DD. Normal RV.    Review of Systems: [y] = yes, [ ]  = no   General: Weight gain [ ] ; Weight loss [ ] ; Anorexia [ ] ; Fatigue [ Y]; Fever [ ] ; Chills [ ] ; Weakness [ ]   Cardiac: Chest pain/pressure [ ] ; Resting SOB [ ] ; Exertional SOB [Y ]; Orthopnea [ ] ; Pedal Edema [ ] ; Palpitations [ ] ; Syncope [ ] ; Presyncope [ ] ; Paroxysmal nocturnal dyspnea[ ]   Pulmonary: Cough [ ] ; Wheezing[ ] ; Hemoptysis[ ] ; Sputum [ ] ; Snoring [ ]   GI: Vomiting[ ] ; Dysphagia[ ] ; Melena[ ] ; Hematochezia [ ] ; Heartburn[ ] ; Abdominal pain [ ] ; Constipation [ ] ; Diarrhea [ ] ; BRBPR [ ]   GU: Hematuria[ ] ; Dysuria [ ] ; Nocturia[ ]   Vascular: Pain in legs with walking [ ] ; Pain in feet with  lying flat [ ] ; Non-healing sores [ ] ; Stroke [ ] ; TIA [ ] ; Slurred speech [ ] ;  Neuro: Headaches[ ] ; Vertigo[ ] ; Seizures[ ] ; Paresthesias[ ] ;Blurred vision [ ] ; Diplopia [ ] ; Vision changes [ ]   Ortho/Skin: Arthritis [ ] ; Joint pain [Y ]; Muscle pain [ ] ; Joint swelling [ ] ; Back Pain [ ] ; Rash [ ]   Psych: Depression[ ] ; Anxiety[ ]   Heme: Bleeding problems [ ] ; Clotting disorders [ ] ; Anemia [ ]   Endocrine: Diabetes [Y ]; Thyroid dysfunction[ ]   Home Medications Prior to Admission medications   Medication Sig Start Date End Date Taking? Authorizing Provider  furosemide (LASIX) 40 MG tablet Take 40 mg by mouth 2 (two) times daily.   Yes [provider]  sacubitril-valsartan (ENTRESTO) 97-103 MG Take 1 tablet by mouth 2 (two) times daily.   Yes [provider]    Past Medical History: Past Medical History:  Diagnosis Date  . CHF (congestive heart failure) (HCC)   . Diabetes mellitus without complication (HCC)   . Hypertension   . MI (myocardial infarction) [facility])     Past Surgical History: Past Surgical History:  Procedure Laterality Date  . CORONARY ANGIOGRAPHY N/A 11/20/2016   Procedure: CORONARY ANGIOGRAPHY;  Surgeon: Rinaldo Cloud, MD;  Location: [facility] INVASIVE CV  LAB;  Service: Cardiovascular;  Laterality: N/A;  . CORONARY ANGIOPLASTY WITH STENT PLACEMENT    . IABP INSERTION N/A 28-Dec-2016   Procedure: IABP Insertion;  Surgeon: Rinaldo Cloud, MD;  Location: [facility] INVASIVE CV LAB;  Service: Cardiovascular;  Laterality: N/A;    Family History: Family History  Problem Relation Age of Onset  . COPD Mother   . Drug abuse Brother     Social History: Social History   Social History  . Marital status: Single    Spouse name: N/A  . Number of children: N/A  . Years of education: N/A   Social History Main Topics  . Smoking status: Current Every Day Smoker    Types: Cigarettes  . Smokeless tobacco: Never Used  . Alcohol use No  . Drug use: No  . Sexual  activity: Not Asked   Other Topics Concern  . None   Social History Narrative  . None    Allergies:  Allergies  Allergen Reactions  . Aldactone [Spironolactone]     Patient was affected by added breast tissue from this drug eight years ago    Objective:    Vital Signs:   Temp:  [97.2 F (36.2 C)-98.9 F (37.2 C)] 98.3 F (36.8 C) (10/30 0814) Pulse Rate:  [47-102] 90 (10/30 1000) Resp:  [13-31] 21 (10/30 1000) BP: (105-134)/(64-109) 115/83 (10/30 1000) SpO2:  [91 %-100 %] 98 % (10/30 1000) Arterial Line BP: (103-185)/(67-145) 103/67 (10/30 1000) Weight:  [233 lb 4 oz (105.8 kg)] 233 lb 4 oz (105.8 kg) (10/30 0500) Last BM Date:  (pta)  Weight change: Filed Weights   12-28-16 1000 11/13/2016 0500  Weight: 237 lb 3.4 oz (107.6 kg) 233 lb 4 oz (105.8 kg)    Intake/Output:   Intake/Output Summary (Last 24 hours) at 12/01/2016 1038 Last data filed at 11/21/2016 1000  Gross per 24 hour  Intake          1230.68 ml  Output             1410 ml  Net          -179.32 ml      Physical Exam    General:  Appears chronically ill. On 2 liters.  Well appearing. No resp difficulty. HEENT: normal Neck: supple. JVP difficult to assess due to body habitus . Carotids 2+ bilat; no bruits. No lymphadenopathy or thyromegaly appreciated. Cor: PMI nondisplaced. Regular rate & rhythm. No rubs, gallops or murmurs. Lungs: Diminished on 2 liters Abdomen: soft, nontender, nondistended. No hepatosplenomegaly. No bruits or masses. Good bowel sounds. Extremities: no cyanosis, clubbing, rash, edema. R groin IABP. RLE Immobilizer.  Neuro: alert & orientedx3, cranial nerves grossly intact. moves all 4 extremities w/o difficulty. Affect flat    Telemetry   Sinus Tach 101 personally reviewed  EKG  10/29Sinus Rhythm 100 bpm ST Elevation V leads    Labs   Basic Metabolic Panel:  Recent Labs Lab 12/28/2016 0455 Dec 28, 2016 0503 11/13/2016 0357  NA 137 138 133*  K 4.1 4.0 4.1  CL 104 102 105    CO2 21*  --  19*  GLUCOSE 183* 184* 143*  BUN 8 8 24*  CREATININE 1.30* 1.20 1.23  CALCIUM 8.7*  --  8.4*    Liver Function Tests:  Recent Labs Lab 12/28/16 0455  AST 29  ALT 15*  ALKPHOS 56  BILITOT 0.8  PROT 6.6  ALBUMIN 3.7   No results for input(s): LIPASE, AMYLASE in the last 168 hours. No results  for input(s): AMMONIA in the last 168 hours.  CBC:  Recent Labs Lab 02/18/2016 0455 02/18/2016 0503 12/01/2016 0357  WBC 11.0*  --  11.4*  NEUTROABS 3.9  --   --   HGB 16.0 16.7 12.8*  HCT 48.0 49.0 36.9*  MCV 94.5  --  90.9  PLT 178  --  157    Cardiac Enzymes:  Recent Labs Lab 02/18/2016 0455 02/18/2016 0755 02/18/2016 1414 02/18/2016 1801 11/25/2016 0357  TROPONINI 0.04* 0.23* 19.91* >65.00* >65.00*    BNP: BNP (last 3 results)  Recent Labs  02/18/2016 0455  BNP 2,744.8*    ProBNP (last 3 results) No results for input(s): PROBNP in the last 8760 hours.   CBG:  Recent Labs Lab 02/18/2016 0455 02/18/2016 1201 02/18/2016 1642 12/03/2016 0147 11/08/2016 0748  GLUCAP 171* 158* 143* 129* 140*    Coagulation Studies:  Recent Labs  02/18/2016 1801  LABPROT 13.8  INR 1.07     Imaging    No results found.   Medications:     Current Medications: . active partnership for health of your heart book   Does not apply Once  . aspirin  81 mg Oral Daily  . atorvastatin  80 mg Oral q1800  . carvedilol  3.125 mg Oral BID WC  . Chlorhexidine Gluconate Cloth  6 each Topical Daily  . clopidogrel  75 mg Oral Q breakfast  . insulin aspart  0-9 Units Subcutaneous TID WC  . levalbuterol  1.25 mg Nebulization TID  . Living Better with Heart Failure Book   Does not apply Once  . pantoprazole  40 mg Oral Q0600  . potassium chloride  10 mEq Oral BID  . sodium chloride flush  10-40 mL Intracatheter Q12H  . sodium chloride flush  3 mL Intravenous Q12H  . sodium chloride flush  3 mL Intravenous Q12H  . sodium chloride flush  3 mL Intravenous Q12H     Infusions: .  sodium chloride    . sodium chloride Stopped (02/18/2016 1015)  . sodium chloride    . sodium chloride    . heparin    . nitroGLYCERIN Stopped (11/09/2016 0035)       Patient Profile  Mr Luna GlasgowKeck is 61 year old with a history CAD, MI 2009 with PCI  to LAD, ICM, chronic systolic heart failure, HTN, DMII, hyperlipidemia, and smoker.   Admitted with CP. STEMI. LHC with IABP placed 11/19/2016    Assessment/Plan  1. Chest Pain - STEMI Troponin >65.  Previous DES to LAD in 2009.  LHC 11/19/2016 with multivessel disease.  On statin, asa, bb, and plavix. Stop bb for now. Check CO-OX.  2. Acute/Chronic Systolic Heart Failure - ICM  ECHO 2018 EF 15% RV normal.  EF has been down for years. He previously refused ICD.  -IABP 1:1 in the cath lab. Continue for now. Place PICC.  -Check CO-OX /CVP.  -CXR on admit with pulmonary edema. Start 80 mg IV lasix twice daily.  - Stop bb for now. - No Arni for now. Was entresto 97-103 mg twice prior to admit.  - No spiro due to allergy.  3. Hyperlipidemia on statin 4. Smoker- discussed smoking cessation.  Length of Stay: 1  Amy Clegg, NP  12/03/2016, 10:38 AM  Advanced Heart Failure Team Pager [phone] (M-F; 7a - 4p)  Please contact [facility] for night-coverage after hours (4p -7a ) and weekends on [email or web address]  Patient seen with NP, agree with the above note.   1.  CAD: Patient admitted with anterior STEMI, LAD occluded at the ostium (LAD and LCx have separate ostia, LAD occluded within an ostial stent placed in 2009).  Unable to open the LAD percutaneously at initial presentation.  No target for CABG.  I reviewed cath film => there is diffuse moderate disease in the LCx and RCA territory but no severe/critical stenosis that would be an obvious interventional target at this point.  For now, I think that we are left with medical management.  - Continue ASA 81 and Plavix 75.  - Continue high dose statin.  2. Acute on chronic systolic CHF/cardiogenic  shock: Patient had baseline EF 20-25% from 2009, ischemic cardiomyopathy.  With anterior MI, EF down 15% but RV looks ok.  PICC placed, co-ox 46% with CVP 9-10 with IABP 1:1.  - Continue IABP 1:1.  - Add milrinone 0.25 mcg/kg/min with low co-ox.  - Add digoxin 0.125 daily.  - Lasix 80 mg IV bid.  - Hold Entresto for now.  - As above, he does not appear to have good percutaneous or CABG options to revascularize the LAD.  LVAD would be an eventual consideration though patient states that he "would not want to be cut on."  Hopefully with some time we can wean the IABP and eventually milrinone.  3. Smoking: Cessation recommended.    Marca Ancona 12/01/2016 1:43 PM

## 2016-12-07 NOTE — Interval H&P Note (Signed)
Cath Lab Visit (complete for each Cath Lab visit)  Clinical Evaluation Leading to the Procedure:   ACS: Yes.    Non-ACS:    Anginal Classification: CCS IV  Anti-ischemic medical therapy: Maximal Therapy (2 or more classes of medications)  Non-Invasive Test Results: No non-invasive testing performed  Prior CABG: No previous CABG      History and Physical Interval Note:  09-14-2016 12:50 AM  Eric Benson  has presented today for surgery, with the diagnosis of cp  The various methods of treatment have been discussed with the patient and family. After consideration of risks, benefits and other options for treatment, the patient has consented to  Procedure(s): LEFT HEART CATH AND CORONARY ANGIOGRAPHY (N/A) as a surgical intervention .  The patient's history has been reviewed, patient examined, no change in status, stable for surgery.  I have reviewed the patient's chart and labs.  Questions were answered to the patient's satisfaction.     Rinaldo CloudHarwani, Derinda Bartus

## 2016-12-07 NOTE — Care Management Note (Signed)
Case Management Note  Patient Details  Name: Eric Benson MRN: [id number] Date of Birth: Apr 07, 1955  Subjective/Objective:   From home, presents with CP. STEMI. LHC with IABP placed 01/11/2017 , patient expired 10/30.                Action/Plan:   Expected Discharge Date:  11/07/16               Expected Discharge Plan:  Home w Home Health Services  In-House Referral:  NA  Discharge planning Services  CM Consult  Post Acute Care Choice:    Choice offered to:  Patient  DME Arranged:    DME Agency:     HH Arranged:    HH Agency:     Status of Service:  Completed, signed off  If discussed at [employer] Meetings, dates discussed:    Additional Comments:  Leone Havenaylor, Renatta Shrieves [place], RN 11/13/2016, 4:05 PM

## 2016-12-07 NOTE — Progress Notes (Signed)
Peripherally Inserted Central Catheter/Midline Placement  The IV Nurse has discussed with the patient and/or persons authorized to consent for the patient, the purpose of this procedure and the potential benefits and risks involved with this procedure.  The benefits include less needle sticks, lab draws from the catheter, and the patient may be discharged home with the catheter. Risks include, but not limited to, infection, bleeding, blood clot (thrombus formation), and puncture of an artery; nerve damage and irregular heartbeat and possibility to perform a PICC exchange if needed/ordered by physician.  Alternatives to this procedure were also discussed.  Bard Power PICC patient education guide, fact sheet on infection prevention and patient information card has been provided to patient /or left at bedside.    PICC/Midline Placement Documentation  PICC Double Lumen 11/25/2016 PICC Right Basilic 47 cm (Active)  Indication for Insertion or Continuance of Line Vasoactive infusions 11/24/2016 11:00 AM  Site Assessment Clean;Dry;Intact 11/07/2016 11:00 AM  Lumen #1 Status Flushed;Blood return noted 11/16/2016 11:00 AM  Lumen #2 Status Flushed;Blood return noted 11/19/2016 11:00 AM  Dressing Type Transparent 11/28/2016 11:00 AM  Dressing Status Clean;Dry;Intact;Antimicrobial disc in place 11/18/2016 11:00 AM  Dressing Intervention New dressing 11/06/2016 11:00 AM  Dressing Change Due 12/12/16 11/22/2016 11:00 AM       Stacie GlazeJoyce, Natilie Krabbenhoft Horton 12/06/2016, 11:58 AM

## 2016-12-07 NOTE — Plan of Care (Signed)
Problem: Activity: Goal: Risk for activity intolerance will decrease Outcome: Not Progressing Pt is to remain on bedrest due to insertion of IABP.

## 2016-12-07 NOTE — Consult Note (Addendum)
Cardiology Consultation:   Patient ID: Eric Benson; [id number]; May 23, 1955   Admit date: December 09, 2016 Date of Consult: 11/20/2016  Primary Care Provider: Rinaldo Cloud, MD Primary Cardiologist: Dr. Sharyn Lull    Patient Profile:   Eric Benson is a 61 y.o. 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 Sharyn Lull.  History of Present Illness:   Eric Benson was admitted to [facility] yesterday with c/o sudden onset of SOB, CP admitted with acute pulmonary edema, ACS, symptoms improved with lasix, ASA, s/l NTG, breathing treatment and initially BIPAP >> n/c O2.  Last evening with CP, rise in troponin and EKG changes concerning for STEMI he was brought to the cath lab emergently. He had IABP placed, he had multivessel CAD, LAD 100% unsuccessful attempts to intervene.  Echo noted EF now 15-20%  LABS Trop I: 0.04, 0.23, 19.91, >65.00, >65.00 K+ 4.1 BUN/Creat 24/1.23 WBC 11.4 H/H 12/36 plts157  The patient is feeling better today, no active CP, less SOB.  He states historically has been leary of ICD implant without any guarantees that it would work out for him.     Past Medical History:  Diagnosis Date  . CHF (congestive heart failure) (HCC)   . Diabetes mellitus without complication (HCC)   . Hypertension   . MI (myocardial infarction) [facility])     Past Surgical History:  Procedure Laterality Date  . CORONARY ANGIOGRAPHY N/A 2016/12/09   Procedure: CORONARY ANGIOGRAPHY;  Surgeon: Rinaldo Cloud, MD;  Location: [facility] INVASIVE CV LAB;  Service: Cardiovascular;  Laterality: N/A;  . CORONARY ANGIOPLASTY WITH STENT PLACEMENT    . IABP INSERTION N/A 12/09/2016   Procedure: IABP Insertion;  Surgeon: Rinaldo Cloud, MD;  Location: [facility] INVASIVE CV LAB;   Service: Cardiovascular;  Laterality: N/A;       Inpatient Medications: Scheduled Meds: . active partnership for health of your heart book   Does not apply Once  . aspirin  81 mg Oral Daily  . atorvastatin  80 mg Oral q1800  . carvedilol  3.125 mg Oral BID WC  . Chlorhexidine Gluconate Cloth  6 each Topical Daily  . clopidogrel  75 mg Oral Q breakfast  . insulin aspart  0-9 Units Subcutaneous TID WC  . levalbuterol  1.25 mg Nebulization TID  . Living Better with Heart Failure Book   Does not apply Once  . pantoprazole  40 mg Oral Q0600  . potassium chloride  10 mEq Oral BID  . sodium chloride flush  10-40 mL Intracatheter Q12H  . sodium chloride flush  3 mL Intravenous Q12H  . sodium chloride flush  3 mL Intravenous Q12H  . sodium chloride flush  3 mL Intravenous Q12H   Continuous Infusions: . sodium chloride    . sodium chloride Stopped (2016/12/09 1015)  . sodium chloride    . sodium chloride    . heparin    . nitroGLYCERIN Stopped (11/10/2016 0035)   PRN Meds: sodium chloride, sodium chloride, sodium chloride, acetaminophen, morphine injection, nitroGLYCERIN, ondansetron (ZOFRAN) IV, sodium chloride flush, sodium chloride flush, sodium chloride flush, sodium chloride flush  Allergies:    Allergies  Allergen Reactions  . Aldactone [Spironolactone]     Patient was affected by added  breast tissue from this drug eight years ago    Social History:   Social History   Social History  . Marital status: Single    Spouse name: N/A  . Number of children: N/A  . Years of education: N/A   Occupational History  . Not on file.   Social History Main Topics  . Smoking status: Current Every Day Smoker    Types: Cigarettes  . Smokeless tobacco: Never Used  . Alcohol use No  . Drug use: No  . Sexual activity: Not on file   Other Topics Concern  . Not on file   Social History Narrative  . No narrative on file    Family History:   Patient doesn't want to cover this at  this time.  Denies cardiac family history  ROS:  Please see the history of present illness.  ROS  All other ROS reviewed and negative.     Physical Exam/Data:   Vitals:   2017/01/04 0800 04-Jan-2017 0814 January 04, 2017 0900 January 04, 2017 1000  BP: (!) 130/96  105/64 115/83  Pulse: 80  92 90  Resp: 13  17 (!) 21  Temp:  98.3 F (36.8 C)    TempSrc:  Oral    SpO2: 99% 99% 100% 98%  Weight:      Height:        Intake/Output Summary (Last 24 hours) at January 04, 2017 1011 Last data filed at Jan 04, 2017 1000  Gross per 24 hour  Intake          1230.68 ml  Output             1410 ml  Net          -179.32 ml   Filed Weights   11/30/2016 1000 2017/01/04 0500  Weight: 237 lb 3.4 oz (107.6 kg) 233 lb 4 oz (105.8 kg)   Body mass index is 32.53 kg/m.  General:  Well nourished, well developed, in no acute distress HEENT: normal Lymph: no adenopathy Neck: no JVD Endocrine:  No thryomegaly Vascular: No carotid bruits Cardiac:  RRR; 1/6 SM Lungs: CTA b/l (anterior/lat auscultation only), no wheezing, rhonchi or rales  Abd: soft, nontender Ext: no edema, b/l feet are warm Musculoskeletal:  No deformities Skin: warm and dry  Neuro:  No gross focal abnormalities noted Psych:  Normal affect   EKG:  The EKG was personally reviewed and demonstrates:   #1 likely ST or AT 120bpm #2 SR 1st degree AVBlock, RBBB, PR , QRS , ST elevation V2-5, baseline motion #3 SR 89bpm, 1st degree AVblock, RBBB, ST changes persist, lesser degree Telemetry:  Telemetry was personally reviewed and demonstrates:   SR 80's, 5 and13 beat NSVT yesterday, none since  Relevant CV Studies:  January 04, 2017 LHC/IABP insertion  Prox RCA to Mid RCA lesion, 70 %stenosed.  Mid RCA to Dist RCA lesion, 75 %stenosed.  Mid Cx lesion, 70 %stenosed.  Ost 2nd Mrg to 2nd Mrg lesion, 70 %stenosed.  Ost LAD to Prox LAD lesion, 100 %stenosed.  11/13/2016: TTE Study Conclusions - Left ventricle: The cavity size was moderately dilated. There  was   moderate concentric hypertrophy. Systolic function was severely   reduced. The estimated ejection fraction was in the range of 15%   to 20%. Diffuse hypokinesis. Doppler parameters are consistent   with a reversible restrictive pattern, indicative of decreased   left ventricular diastolic compliance and/or increased left   atrial pressure (grade 3 diastolic dysfunction). - Left atrium: The atrium was moderately dilated. -  Atrial septum: No defect or patent foramen ovale was identified.  Laboratory Data:  Chemistry Recent Labs Lab Dec 14, 2016 0455 Dec 14, 2016 0503 11/27/2016 0357  NA 137 138 133*  K 4.1 4.0 4.1  CL 104 102 105  CO2 21*  --  19*  GLUCOSE 183* 184* 143*  BUN 8 8 24*  CREATININE 1.30* 1.20 1.23  CALCIUM 8.7*  --  8.4*  GFRNONAA 58*  --  >60  GFRAA >60  --  >60  ANIONGAP 12  --  9     Recent Labs Lab Dec 14, 2016 0455  PROT 6.6  ALBUMIN 3.7  AST 29  ALT 15*  ALKPHOS 56  BILITOT 0.8   Hematology Recent Labs Lab Dec 14, 2016 0455 Dec 14, 2016 0503 11/10/2016 0357  WBC 11.0*  --  11.4*  RBC 5.08  --  4.06*  HGB 16.0 16.7 12.8*  HCT 48.0 49.0 36.9*  MCV 94.5  --  90.9  MCH 31.5  --  31.0  MCHC 33.3  --  34.1  RDW 14.1  --  14.3  PLT 178  --  157   Cardiac Enzymes Recent Labs Lab Dec 14, 2016 0455 Dec 14, 2016 0755 Dec 14, 2016 1414 Dec 14, 2016 1801 11/19/2016 0357  TROPONINI 0.04* 0.23* 19.91* >65.00* >65.00*    Recent Labs Lab Dec 14, 2016 0502  TROPIPOC 0.06    BNP Recent Labs Lab Dec 14, 2016 0455  BNP 2,744.8*    DDimer No results for input(s): DDIMER in the last 168 hours.  Radiology/Studies:   Dg Chest Portable 1 View Result Date: October 04, 2016 CLINICAL DATA:  Substernal chest pain and shortness of breath. EXAM: PORTABLE CHEST 1 VIEW COMPARISON:  Radiograph 08/19/2015 FINDINGS: Stable cardiomegaly and mediastinal contours. Atherosclerosis of the aortic arch. Diffuse pulmonary edema and Kerley B-lines. Minimal fluid in the right minor fissure. No focal airspace  disease. No large subpulmonic effusion. No pneumothorax. Grossly stable osseous structures. IMPRESSION: Moderate pulmonary edema.  Unchanged cardiomegaly. Electronically Signed   By: Rubye OaksMelanie  Ehinger M.D.   On: October 04, 2016 05:05    Assessment and Plan:   1. Acute AW STEMI     Trop not yet trending down, unable to intervene on LAD last night      IABP in place, planned for another 24 hours  2. Acute pulmonary edema     AHF team on  Board  3. ICM     Known hx of ICM, EF by attending cardiologist has been 25-30%      Patient hd declined ICD evaluation by records     On Entresto, plavix, and lasix out patient     EF now 15-20%    We Shivaun Bilello defer care to primary cardiologist and CHF team.  EP Freya Zobrist follow from afar for now.  Can discuss with patient more as his hospitalization/clinical course progresses regarding ICD.  ADDEND: Dr. Elberta Fortisamnitz attempted to see the patient today, though the patient declined the visit to discuss ICD at this time.  Rael Tilly round tomorrow again with covering EP MD.    For questions or updates, please contact [facility] Please consult [email or web address] for contact info under Cardiology/STEMI.   Signed, Sheilah PigeonRenee Lynn Ursuy, PA-C  12/02/2016 10:11 AM  I have seen and examined this patient with Francis Dowseenee Ursuy.  Agree with above, note added to reflect my findings.  On exam, the patient had an intermittent pulse.  Upon walking to the room, the patient was in the midst of a bradycardia arrhythmic code.  The patient went into sudden complete AV block.  He was subsequently intubated.  See  code documentation.   just prior to intubation, the patient went into ventricular tachycardia.  His pressures remained stable until post intubation.  After intubation, the patient required defibrillation.  This resulted in intermittent ventricular rhythm with stable blood pressures.  The patient would intermittently go into ventricular tachycardia as well as asystole.  Multiple rounds of epinephrine  were given as well as a bolus of amiodarone.  The patient was subsequently put on an amiodarone infusion.  The patient also received both magnesium and bicarbonate IV.  After multiple rounds of compressions and cardioversions, the patient went into an asystolic rhythm which could not be recovered.  Dr. Sharyn Lull was called to the bedside, who deemed further effort to be futile.  No further compressions or life-saving measures were performed.   Fayetta Sorenson M. Chabeli Barsamian MD 12-30-2016 2:35 PM

## 2016-12-07 NOTE — Telephone Encounter (Signed)
[facility] [employer] [facility]C For Dr.McLean to Sign. Called funeral home back made them aware they will need to come pick back up and take to hospital.

## 2016-12-07 NOTE — Anesthesia Procedure Notes (Signed)
Procedure Name: Intubation Date/Time: 11/17/2016 2:05 PM Performed by: Elliot DallyHUGGINS, Noboru Bidinger Pre-anesthesia Checklist: Patient identified, Emergency Drugs available, Suction available and Patient being monitored Patient Re-evaluated:Patient Re-evaluated prior to induction Oxygen Delivery Method: Ambu bag Preoxygenation: Pre-oxygenation with 100% oxygen Induction Type: IV induction, Cricoid Pressure applied and Rapid sequence Laryngoscope Size: Miller and 3 Grade View: Grade I Tube type: Subglottic suction tube Tube size: 7.5 mm Number of attempts: 1 Airway Equipment and Method: Stylet Placement Confirmation: ETT inserted through vocal cords under direct vision,  positive ETCO2,  breath sounds checked- equal and bilateral and CO2 detector Secured at: 24 cm Tube secured with: Tape Dental Injury: Teeth and Oropharynx as per pre-operative assessment

## 2016-12-07 NOTE — Progress Notes (Signed)
ANTICOAGULATION CONSULT NOTE - Follow Up Consult  Pharmacy Consult for Heparin Indication: chest pain/ACS  Allergies  Allergen Reactions  . Aldactone [Spironolactone]     Patient was affected by added breast tissue from this drug eight years ago    Patient Measurements: Height: 5\' 11"  (180.3 cm) Weight: 233 lb 4 oz (105.8 kg) IBW/kg (Calculated) : 75.3 Heparin Dosing Weight: 98kg  Vital Signs: Temp: 98.3 F (36.8 C) (10/30 0814) Temp Source: Oral (10/30 0814) BP: 130/96 (10/30 0800) Pulse Rate: 80 (10/30 0800)  Labs:  Recent Labs  September 19, 2016 0455 September 19, 2016 0503  September 19, 2016 1414 September 19, 2016 1801 12/02/2016 0357  HGB 16.0 16.7  --   --   --  12.8*  HCT 48.0 49.0  --   --   --  36.9*  PLT 178  --   --   --   --  157  LABPROT  --   --   --   --  13.8  --   INR  --   --   --   --  1.07  --   HEPARINUNFRC  --   --   --   --  0.64  --   CREATININE 1.30* 1.20  --   --   --  1.23  TROPONINI 0.04*  --   < > 19.91* >65.00* >65.00*  < > = values in this interval not displayed.  Estimated Creatinine Clearance: 78.1 mL/min (by C-G formula based on SCr of 1.23 mg/dL).   Assessment: 61 y.o. male s/p cath yesterday found to have proximal LAD in-stent restenosis which was unable to be intervened upon, and IABP was placed. Pharmacy asked to resume heparin 6 hours post left sheath removal - this was done at 0800 this morning.  Goal of Therapy:  Heparin level 0.2-0.5 Monitor platelets by anticoagulation protocol: Yes   Plan:  1) At 1400 today, resume heparin at 1300 units/hr 2) 6 hour heparin level 3) Daily heparin level and CBC  Louie CasaJennifer Joshuan Bolander, PharmD, BCPS  11/21/2016 9:17 AM

## 2016-12-07 NOTE — Progress Notes (Addendum)
Answered Code Blue for this patient.  Arrived at room to large medical team working in active code.  Instructed that family was in waiting room.  Chaplain found family and sat with them while code was being worked.  Prayed with mom of patient and accompanied MD, Dr. Sharyn LullHarwani to speak with family x2.   Family notified chaplain that family pastor was coming and that they would like to visit with the deceased when he arrived.  Unit secretary notified.  Accompanied sister and her pastor back to see deceased patient.  Grief and emotional support provided to them and other family members in consult room at [facility] waiting.   11/25/2016 1519  Clinical Encounter Type  Visited With Family;Health care provider  Visit Type Initial;Psychological support;Spiritual support;Code;Death;Critical Care  Referral From Nurse  Consult/Referral To Chaplain  Spiritual Encounters  Spiritual Needs Ritual;Emotional;Grief support

## 2016-12-07 NOTE — Progress Notes (Signed)
Subjective:  Patient denies any chest pain or shortness of breath. Remains on intra-aortic balloon pump with good diastolic augmentation  Objective:  Vital Signs in the last 24 hours: Temp:  [97.2 F (36.2 C)-98.9 F (37.2 C)] 98.3 F (36.8 C) (10/30 0814) Pulse Rate:  [47-102] 80 (10/30 0800) Resp:  [13-31] 13 (10/30 0800) BP: (104-134)/(70-109) 130/96 (10/30 0800) SpO2:  [91 %-100 %] 99 % (10/30 0814) Arterial Line BP: (112-185)/(70-145) 114/78 (10/30 0800) Weight:  [105.8 kg (233 lb 4 oz)-107.6 kg (237 lb 3.4 oz)] 105.8 kg (233 lb 4 oz) (10/30 0500)  Intake/Output from previous day: 10/29 0701 - 10/30 0700 In: 1171.5 [P.O.:540; I.V.:531.5; IV Piggyback:100] Out: 1380 [Urine:1380] Intake/Output from this shift: Total I/O In: 60 [P.O.:60] Out: 125 [Urine:125]  Physical Exam: Neck: no adenopathy, no carotid bruit, no JVD and supple, symmetrical, trachea midline Lungs: clear anteriorly decreased breath sound at bases Heart: regular rate and rhythm, S1, S2 normal and systolic murmur and S3 gallop noted. Abdomen: soft, non-tender; bowel sounds normal; no masses,  no organomegaly Extremities: extremities normal, atraumatic, no cyanosis or edema and right groin intra-aortic balloon site no hematoma dressing dry left groin no hematoma and dressing dry  Lab Results:  Recent Labs  12/02/16 0455 12/02/16 0503 12/04/2016 0357  WBC 11.0*  --  11.4*  HGB 16.0 16.7 12.8*  PLT 178  --  157    Recent Labs  12/02/16 0455 12/02/16 0503 11/17/2016 0357  NA 137 138 133*  K 4.1 4.0 4.1  CL 104 102 105  CO2 21*  --  19*  GLUCOSE 183* 184* 143*  BUN 8 8 24*  CREATININE 1.30* 1.20 1.23    Recent Labs  12/02/16 1801 11/28/2016 0357  TROPONINI >65.00* >65.00*   Hepatic Function Panel  Recent Labs  12/02/16 0455  PROT 6.6  ALBUMIN 3.7  AST 29  ALT 15*  ALKPHOS 56  BILITOT 0.8    Recent Labs  11/29/2016 0357  CHOL 106   No results for input(s): PROTIME in the last 72  hours.  Imaging: Imaging results have been reviewed and Dg Chest Portable 1 View  Result Date: 10/01/16 CLINICAL DATA:  Substernal chest pain and shortness of breath. EXAM: PORTABLE CHEST 1 VIEW COMPARISON:  Radiograph 08/19/2015 FINDINGS: Stable cardiomegaly and mediastinal contours. Atherosclerosis of the aortic arch. Diffuse pulmonary edema and Kerley B-lines. Minimal fluid in the right minor fissure. No focal airspace disease. No large subpulmonic effusion. No pneumothorax. Grossly stable osseous structures. IMPRESSION: Moderate pulmonary edema.  Unchanged cardiomegaly. Electronically Signed   By: Rubye OaksMelanie  Ehinger M.D.   On: 10/01/16 05:05    Cardiac Studies:  Assessment/Plan:  Acute anterolateral wall MI status post left cardiac catheterization/insertion of intra-aortic balloon pump and attempted PCI to 100% occluded in-stent LAD Resolving Acute pulmonary edema Ischemic cardiomyopathy History of non-Q-wave MI in the past status post PCI to LAD approximately 9 years ago Hypertension New-onset diabetes mellitus hyperlipidemia Morbid obesity History of EtOH abuse History of tobacco abuse  plan Continue present management Restart heparin per pharmacy Will keep intra-aortic balloon pump for next 24 hours Check labs in a.m. Monitor urine output Heart failure team consult and EP consult:  LOS: 1 day    Rinaldo CloudHarwani, Deepika [place] 11/24/2016, 9:46 AM

## 2016-12-07 NOTE — Progress Notes (Signed)
Sharyn LullHarwani, MD at bedside. Verbal orders to to stop Aggrastat, Angiomax, Heparin and Nitro gtt. RN to check ACT at 3am for removal of left femoral sheath. 6 hours post removal of sheath, pharmacy to restart Heparin gtt. Pharmacy notified.

## 2016-12-07 NOTE — Progress Notes (Signed)
Patients time of death 1448 on 11/20/2016. Heart and lung sounds assessed for one full minute with Lynetta MareHeather Bowman, RN. None were auscultated.   CDS Referral Information:   Referral Number: [id number] Representative: Felipa EthRobert Kiser  Patients family visiting prior, during, and after code.

## 2016-12-07 DEATH — deceased

## 2017-01-06 NOTE — Discharge Summary (Signed)
NAMChales Salmon:  Merkin, Rashaan                ACCOUNT NO.:  [id number]  MEDICAL RECORD NO.:  [id number]  LOCATION:                                 FACILITY:  PHYSICIAN:  Azul Brumett N. Sharyn LullHarwani, M.D.      DATE OF BIRTH:  DATE OF ADMISSION:  11/29/2016 DATE OF DISCHARGE:  08/28/2016                              DISCHARGE SUMMARY   BRIEF DEATH SUMMARY  ADMITTING DIAGNOSES: 1. Acute systolic congestive heart failure, rule out ischemia, rule     out myocardial infarction. 2. Acute coronary syndrome. 3. Coronary artery disease, history of non-Q-wave myocardial     infarction in the past. 4. Ischemic dilated cardiomyopathy. 5. Status post hypertensive emergency. 6. Diabetes mellitus. 7. Morbid obesity. 8. Hyperlipidemia. 9. History of tobacco abuse. 10.Prior history of ethyl alcohol abuse.  FINAL DIAGNOSES: 1. Status post cardiac arrest/complete heart block, status post     sustained ventricular tachycardia. 2. Status post acute anterolateral wall myocardial infarction, status     post left cardiac catheterization/insertion of intra-aortic balloon     pump and attempted PCI to 100% occluded in-stent LAD restenosis. 3. Acute pulmonary edema. 4. Cardiogenic shock. 5. Ischemic cardiomyopathy. 6. History of non-Q-wave myocardial infarction in the past, status     post PCI to LAD approximately 9 years ago. 7. Hypertension. 8. Diabetes mellitus. 9. Hyperlipidemia. 10.Morbid obesity. 11.Tobacco abuse. 12.History of ethyl alcohol abuse.  BRIEF HISTORY AND HOSPITAL COURSE:  Mr. Luna GlasgowKeck was 61 year old male with past medical history significant for coronary artery disease, history of non-Q-wave myocardial infarction approximately 9 years ago, requiring PCI to LAD, ischemic cardiomyopathy with EF approximately 20-30%, hypertension, diabetes mellitus, hyperlipidemia, morbid obesity, tobacco abuse, ETOH abuse.  He came to the ER by EMS because of sudden onset of shortness of breath associated with  localized retrosternal chest pain and diaphoresis.  The patient received aspirin, sublingual nitro, IV Lasix, and breathing treatment and was placed on BiPAP with improvement in his symptoms.  The patient presently complains of vague dull, aching retrosternal chest pain.  States overall since much better, the patient is on off BiPAP now, on nasal cannula, maintaining sats in the 90s range.  The patient denies any excessive salty food intake.  Denies any fever or chills.  Denies cough or sore throat.  Denies noncompliance of medication.  The patient has been discussed multiple times at length in the past regarding ICD, but has refused.  PHYSICAL EXAMINATION:  GENERAL:  He was alert, awake, oriented x3. VITAL SIGNS:  Blood pressure was 100/82, pulse 89. HEENT:  Conjunctivae were pink. NECK:  Supple.  Positive JVD.  No bruit. LUNGS:  Decreased breath sounds at bases with faint rales. CARDIOVASCULAR:  S1 and S2 were normal.  There was 2/6 systolic murmur noted. EXTREMITIES:  There was no clubbing, cyanosis, or edema. NEURO:  Grossly intact.  LABORATORY DATA:  His sodium was 137, potassium 4.1, BUN 8, creatinine 1.30.  His BNP was elevated at 2744.8.  Troponin I was slightly elevated at 0.04, 0.06, 0.23.  Repeat troponin was 19.91 and it was 65.  Initial EKG showed ectopic atrial tachycardia, periventricular pattern complexes, anteroseptal infarct, age undetermined, ST depression in lateral  leads.  Repeat EKG done showed sinus rhythm with first-degree AV block with occasional PVCs, nonspecific intraventricular conduction delay, anterolateral wall infarct acute with ST elevation in V1-V5 with reciprocal changes in the inferior leads, which were new as compared to prior EKG done earlier this morning.  Two-D echo showed EF of 50-20% with global hypokinesia.  There was trivial mitral and tricuspid regurgitation.  BRIEF HOSPITAL COURSE:  The patient was admitted to CCU Unit.  The patient  ruled in for myocardial infarction.  The patient had again chest pain in the evening and repeat EKG showed ST elevation in anterolateral leads.  Code STEMI was called and was taken to the cath lab.  As per procedure report, the patient was noted to have 100% occluded ostial and proximal LAD in the stent.  Attempted to recanalize that vessel without any success as there was no guiding support and coronary ostia were almost separate.  Intra-aortic balloon pump was inserted prior to the attempted PCI and the patient was transferred to CCU in stable condition.  Next day, the patient did not complain of any chest pain, was seen in the morning.  Later that morning, the patient developed nonsustained VT and then complete heart block.  ACLS protocol was followed.  The patient received multiple shocks and CPR was continued for approximately 40 minutes without success and finally the patient was pronounced dead.  Family and his sister were present.     Eduardo OsierMohan N. Sharyn LullHarwani, M.D.     [facility]  D:  12/22/2016  T:  12/23/2016  Job:  [id number]

## 2017-01-06 NOTE — Discharge Summary (Signed)
Death summary dictated on 12/21/2016, dictation number is [phone]

## 2018-05-03 IMAGING — DX DG CHEST 1V PORT
1 series · 1 of 1 positions shown · non-contrast
Comparison: Radiograph 08/19/2015

CLINICAL DATA: Substernal chest pain and shortness of breath.

EXAM:
PORTABLE CHEST 1 VIEW

[chest]
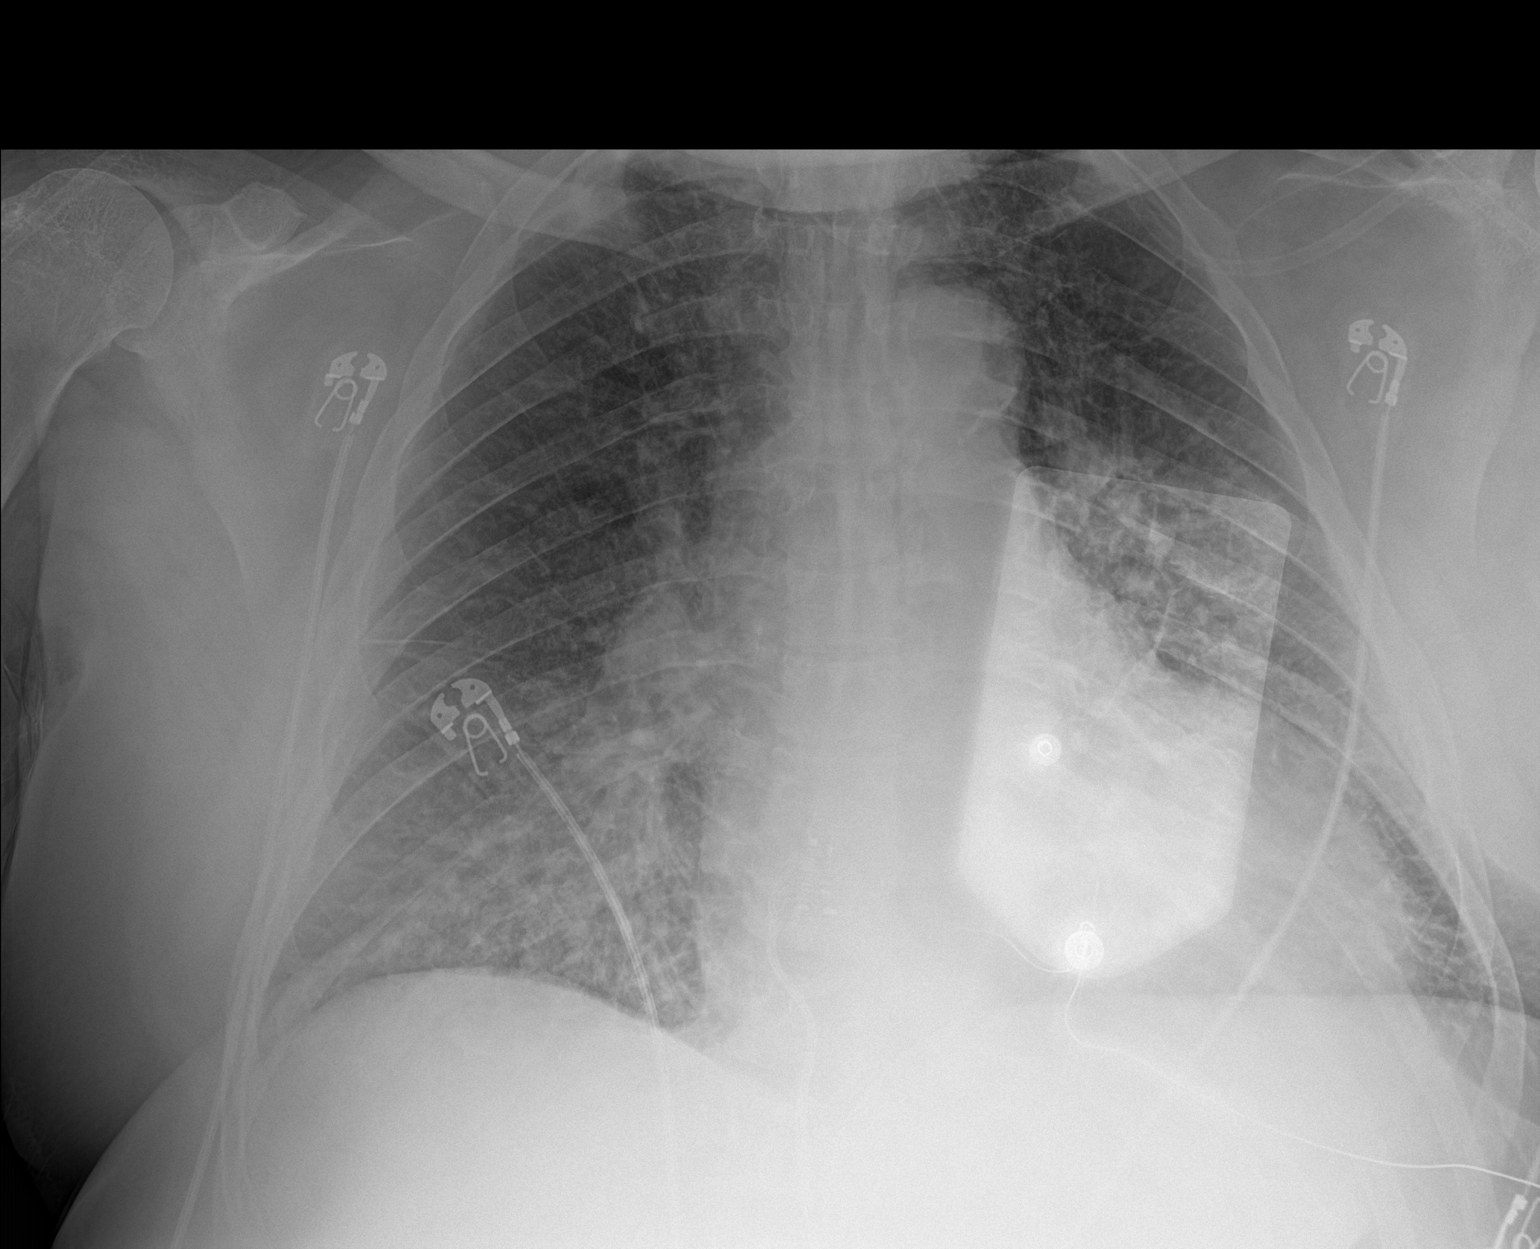

[1 of 1 positions shown; findings below may reference images not displayed]

FINDINGS: Stable cardiomegaly and mediastinal contours. Atherosclerosis of the
aortic arch. Diffuse pulmonary edema and Kerley B-lines. Minimal
fluid in the right minor fissure. No focal airspace disease. No
large subpulmonic effusion. No pneumothorax. Grossly stable osseous
structures.
IMPRESSION: Moderate pulmonary edema.  Unchanged cardiomegaly.
